# Patient Record
Sex: Female | Born: 1965 | Race: Black or African American | Hispanic: No | Marital: Single | Smoking: Never smoker
Health system: Southern US, Community
[De-identification: ages and names within clinical notes are randomized; demographics above are authoritative.]

## PROBLEM LIST (undated history)

## (undated) DIAGNOSIS — I10 Essential (primary) hypertension: Secondary | ICD-10-CM

## (undated) DIAGNOSIS — Z8719 Personal history of other diseases of the digestive system: Secondary | ICD-10-CM

## (undated) DIAGNOSIS — Z8041 Family history of malignant neoplasm of ovary: Secondary | ICD-10-CM

## (undated) DIAGNOSIS — Z8711 Personal history of peptic ulcer disease: Secondary | ICD-10-CM

## (undated) DIAGNOSIS — Z803 Family history of malignant neoplasm of breast: Secondary | ICD-10-CM

## (undated) DIAGNOSIS — Z800 Family history of malignant neoplasm of digestive organs: Secondary | ICD-10-CM

## (undated) DIAGNOSIS — Z8042 Family history of malignant neoplasm of prostate: Secondary | ICD-10-CM

## (undated) DIAGNOSIS — B019 Varicella without complication: Secondary | ICD-10-CM

## (undated) HISTORY — DX: Personal history of other diseases of the digestive system: Z87.19

## (undated) HISTORY — DX: Family history of malignant neoplasm of digestive organs: Z80.0

## (undated) HISTORY — DX: Personal history of peptic ulcer disease: Z87.11

## (undated) HISTORY — DX: Family history of malignant neoplasm of breast: Z80.3

## (undated) HISTORY — PX: FOOT SURGERY: SHX648

## (undated) HISTORY — DX: Varicella without complication: B01.9

## (undated) HISTORY — DX: Essential (primary) hypertension: I10

## (undated) HISTORY — DX: Family history of malignant neoplasm of prostate: Z80.42

## (undated) HISTORY — DX: Family history of malignant neoplasm of ovary: Z80.41

---

## 1999-01-15 ENCOUNTER — Other Ambulatory Visit: Admission: RE | Admit: 1999-01-15 | Discharge: 1999-01-15 | Payer: Self-pay | Admitting: Obstetrics and Gynecology

## 2000-03-23 ENCOUNTER — Emergency Department (HOSPITAL_COMMUNITY): Admission: EM | Admit: 2000-03-23 | Discharge: 2000-03-23 | Payer: Self-pay | Admitting: Emergency Medicine

## 2001-03-01 ENCOUNTER — Other Ambulatory Visit: Admission: RE | Admit: 2001-03-01 | Discharge: 2001-03-01 | Payer: Self-pay | Admitting: Obstetrics and Gynecology

## 2002-01-10 ENCOUNTER — Ambulatory Visit (HOSPITAL_COMMUNITY): Admission: RE | Admit: 2002-01-10 | Discharge: 2002-01-10 | Payer: Self-pay | Admitting: Obstetrics and Gynecology

## 2003-02-22 ENCOUNTER — Other Ambulatory Visit: Admission: RE | Admit: 2003-02-22 | Discharge: 2003-02-22 | Payer: Self-pay | Admitting: Obstetrics and Gynecology

## 2004-05-11 ENCOUNTER — Other Ambulatory Visit: Admission: RE | Admit: 2004-05-11 | Discharge: 2004-05-11 | Payer: Self-pay | Admitting: Obstetrics and Gynecology

## 2004-11-11 ENCOUNTER — Encounter: Admission: RE | Admit: 2004-11-11 | Discharge: 2004-11-11 | Payer: Self-pay | Admitting: Family Medicine

## 2005-08-31 ENCOUNTER — Encounter: Admission: RE | Admit: 2005-08-31 | Discharge: 2005-08-31 | Payer: Self-pay | Admitting: Obstetrics and Gynecology

## 2005-12-23 ENCOUNTER — Ambulatory Visit (HOSPITAL_COMMUNITY): Admission: RE | Admit: 2005-12-23 | Discharge: 2005-12-24 | Payer: Self-pay | Admitting: Obstetrics and Gynecology

## 2007-04-13 HISTORY — PX: ABDOMINAL HYSTERECTOMY: SHX81

## 2010-05-28 ENCOUNTER — Other Ambulatory Visit: Payer: Self-pay | Admitting: Family Medicine

## 2010-05-28 ENCOUNTER — Ambulatory Visit
Admission: RE | Admit: 2010-05-28 | Discharge: 2010-05-28 | Disposition: A | Discharge status: Home / Self Care | Payer: Self-pay | Source: Ambulatory Visit | Attending: Family Medicine | Admitting: Family Medicine

## 2010-05-28 DIAGNOSIS — R05 Cough: Secondary | ICD-10-CM

## 2011-11-08 ENCOUNTER — Ambulatory Visit (INDEPENDENT_AMBULATORY_CARE_PROVIDER_SITE_OTHER): Payer: BC Managed Care – PPO | Admitting: Physician Assistant

## 2011-11-08 VITALS — BP 118/80 | HR 50 | Temp 98.0°F | Resp 16 | Ht 67.75 in | Wt 200.2 lb

## 2011-11-08 DIAGNOSIS — L259 Unspecified contact dermatitis, unspecified cause: Secondary | ICD-10-CM

## 2011-11-08 DIAGNOSIS — L255 Unspecified contact dermatitis due to plants, except food: Secondary | ICD-10-CM

## 2011-11-08 DIAGNOSIS — L309 Dermatitis, unspecified: Secondary | ICD-10-CM

## 2011-11-08 DIAGNOSIS — L299 Pruritus, unspecified: Secondary | ICD-10-CM

## 2011-11-08 DIAGNOSIS — L237 Allergic contact dermatitis due to plants, except food: Secondary | ICD-10-CM

## 2011-11-08 MED ORDER — TRIAMCINOLONE ACETONIDE 0.5 % EX OINT: TOPICAL_OINTMENT | Freq: Two times a day (BID) | CUTANEOUS | Status: AC

## 2011-11-08 MED ORDER — HYDROXYZINE HCL 25 MG PO TABS: 12.5000 mg | ORAL_TABLET | Freq: Three times a day (TID) | ORAL | Status: AC | PRN

## 2011-11-08 NOTE — Progress Notes (Signed)
  Subjective:    Patient ID: Tamara Morton, female    DOB: [birth year], 46 y.o.   MRN: [id number]  HPI This 46 y.o. Female presents for itchy rash since yard work on 7/25.  Developed symptoms on the left side of the neck, upper chest and one small area at the umbilicus.  No respiratory symptoms. Topical Benadryl and oral Benadryl have not been effective.  Review of Systems As above.   Past Medical History  Diagnosis Date  . Hypertension     Past Surgical History  Procedure Date  . Abdominal hysterectomy     uterine fibroids    Prior to Admission medications   Medication Sig Start Date End Date Taking? Authorizing Provider  diltiazem (CARDIZEM CD) 240 MG 24 hr capsule Take 240 mg by mouth daily.   Yes Historical Provider, MD  spironolactone (ALDACTONE) 25 MG tablet Take 25 mg by mouth daily.   Yes Historical Provider, MD    No Known Allergies  History   Social History  . Marital Status: Single    Spouse Name: N/A    Number of Children: 0  . Years of Education: N/A   Occupational History  . Production (medical supplies)   Social History Main Topics  . Smoking status: Never Smoker     Family History  Problem Relation Age of Onset  . Hypertension Mother   . Gout Mother   . Glaucoma Mother   . Hypertension Father        Objective:   Physical Exam Blood pressure 118/80, pulse 50, temperature 98 F (36.7 C), temperature source Oral, resp. rate 16, height 5' 7.75" (1.721 m), weight 200 lb 3.2 oz (90.81 kg), SpO2 97.00%. Body mass index is 30.67 kg/(m^2). Well-developed, well nourished BF who is awake, alert and oriented, in NAD. HEENT: [place]/AT, sclera and conjunctiva are clear.  Lungs: Normal effort Skin: warm and dry with urticarial lesions on the left side of the neck, the upper chest and umbilicus.  No drainage or induration.      Assessment & Plan:   1. Dermatitis    2. Itching  hydrOXYzine (ATARAX/VISTARIL) 25 MG tablet, triamcinolone ointment (KENALOG)  0.5 %  3. Poison ivy dermatitis     Patient Instructions  IN addition to the prescribed medications, use over-the-counter Claritin or Zyrtec or Allegra in the morning (the Atarax will make you too sleepy to take during the day while you are working, so you may need to take it only at bedtime).  Try to stay cool and dry, as getting hot and sweaty (or taking a hot shower) will increase your itching.

## 2011-11-08 NOTE — Patient Instructions (Signed)
IN addition to the prescribed medications, use over-the-counter Claritin or Zyrtec or Allegra in the morning (the Atarax will make you too sleepy to take during the day while you are working, so you may need to take it only at bedtime).  Try to stay cool and dry, as getting hot and sweaty (or taking a hot shower) will increase your itching.

## 2015-05-07 ENCOUNTER — Encounter: Payer: Self-pay | Admitting: *Deleted

## 2015-05-07 ENCOUNTER — Telehealth: Payer: Self-pay | Admitting: *Deleted

## 2015-05-07 NOTE — Telephone Encounter (Signed)
Unable to reach patient at time of Pre-Visit Call.  Left message for patient to return call when available.    

## 2015-05-07 NOTE — Telephone Encounter (Signed)
Pre-Visit Call completed with patient and chart updated.   Pre-Visit Info documented in Specialty Comments under [employer].    

## 2015-05-08 ENCOUNTER — Ambulatory Visit (INDEPENDENT_AMBULATORY_CARE_PROVIDER_SITE_OTHER): Payer: Managed Care, Other (non HMO) | Admitting: Family Medicine

## 2015-05-08 ENCOUNTER — Other Ambulatory Visit: Payer: Self-pay

## 2015-05-08 ENCOUNTER — Encounter: Payer: Self-pay | Admitting: Family Medicine

## 2015-05-08 VITALS — BP 126/80 | HR 80 | Temp 98.9°F | Ht 68.75 in | Wt 215.4 lb

## 2015-05-08 DIAGNOSIS — Z0000 Encounter for general adult medical examination without abnormal findings: Secondary | ICD-10-CM

## 2015-05-08 DIAGNOSIS — Z23 Encounter for immunization: Secondary | ICD-10-CM

## 2015-05-08 DIAGNOSIS — I10 Essential (primary) hypertension: Secondary | ICD-10-CM

## 2015-05-08 DIAGNOSIS — Z114 Encounter for screening for human immunodeficiency virus [HIV]: Secondary | ICD-10-CM

## 2015-05-08 MED ORDER — SPIRONOLACTONE 25 MG PO TABS: 25.0000 mg | ORAL_TABLET | Freq: Every day | ORAL | Status: DC

## 2015-05-08 MED ORDER — DILTIAZEM HCL ER COATED BEADS 240 MG PO CP24: 240.0000 mg | ORAL_CAPSULE | Freq: Every day | ORAL | Status: DC

## 2015-05-08 NOTE — Patient Instructions (Signed)
Preventive Care for Adults, Female A healthy lifestyle and preventive care can promote health and wellness. Preventive health guidelines for women include the following key practices.  A routine yearly physical is a good way to check with your health care provider about your health and preventive screening. It is a chance to share any concerns and updates on your health and to receive a thorough exam.  Visit your dentist for a routine exam and preventive care every 6 months. Brush your teeth twice a day and floss once a day. Good oral hygiene prevents tooth decay and gum disease.  The frequency of eye exams is based on your age, health, family medical history, use of contact lenses, and other factors. Follow your health care provider's recommendations for frequency of eye exams.  Eat a healthy diet. Foods like vegetables, fruits, whole grains, low-fat dairy products, and lean protein foods contain the nutrients you need without too many calories. Decrease your intake of foods high in solid fats, added sugars, and salt. Eat the right amount of calories for you.Get information about a proper diet from your health care provider, if necessary.  Regular physical exercise is one of the most important things you can do for your health. Most adults should get at least 150 minutes of moderate-intensity exercise (any activity that increases your heart rate and causes you to sweat) each week. In addition, most adults need muscle-strengthening exercises on 2 or more days a week.  Maintain a healthy weight. The body mass index (BMI) is a screening tool to identify possible weight problems. It provides an estimate of body fat based on height and weight. Your health care provider can find your BMI and can help you achieve or maintain a healthy weight.For adults 20 years and older:  A BMI below 18.5 is considered underweight.  A BMI of 18.5 to 24.9 is normal.  A BMI of 25 to 29.9 is considered overweight.  A  BMI of 30 and above is considered obese.  Maintain normal blood lipids and cholesterol levels by exercising and minimizing your intake of saturated fat. Eat a balanced diet with plenty of fruit and vegetables. Blood tests for lipids and cholesterol should begin at age 45 and be repeated every 5 years. If your lipid or cholesterol levels are high, you are over 50, or you are at high risk for heart disease, you may need your cholesterol levels checked more frequently.Ongoing high lipid and cholesterol levels should be treated with medicines if diet and exercise are not working.  If you smoke, find out from your health care provider how to quit. If you do not use tobacco, do not start.  Lung cancer screening is recommended for adults aged 45-80 years who are at high risk for developing lung cancer because of a history of smoking. A yearly low-dose CT scan of the lungs is recommended for people who have at least a 30-pack-year history of smoking and are a current smoker or have quit within the past 15 years. A pack year of smoking is smoking an average of 1 pack of cigarettes a day for 1 year (for example: 1 pack a day for 30 years or 2 packs a day for 15 years). Yearly screening should continue until the smoker has stopped smoking for at least 15 years. Yearly screening should be stopped for people who develop a health problem that would prevent them from having lung cancer treatment.  If you are pregnant, do not drink alcohol. If you are  breastfeeding, be very cautious about drinking alcohol. If you are not pregnant and choose to drink alcohol, do not have more than 1 drink per day. One drink is considered to be 12 ounces (355 mL) of beer, 5 ounces (148 mL) of wine, or 1.5 ounces (44 mL) of liquor.  Avoid use of street drugs. Do not share needles with anyone. Ask for help if you need support or instructions about stopping the use of drugs.  High blood pressure causes heart disease and increases the risk  of stroke. Your blood pressure should be checked at least every 1 to 2 years. Ongoing high blood pressure should be treated with medicines if weight loss and exercise do not work.  If you are 55-79 years old, ask your health care provider if you should take aspirin to prevent strokes.  Diabetes screening is done by taking a blood sample to check your blood glucose level after you have not eaten for a certain period of time (fasting). If you are not overweight and you do not have risk factors for diabetes, you should be screened once every 3 years starting at age 45. If you are overweight or obese and you are 40-70 years of age, you should be screened for diabetes every year as part of your cardiovascular risk assessment.  Breast cancer screening is essential preventive care for women. You should practice "breast self-awareness." This means understanding the normal appearance and feel of your breasts and may include breast self-examination. Any changes detected, no matter how small, should be reported to a health care provider. Women in their 20s and 30s should have a clinical breast exam (CBE) by a health care provider as part of a regular health exam every 1 to 3 years. After age 40, women should have a CBE every year. Starting at age 40, women should consider having a mammogram (breast X-ray test) every year. Women who have a family history of breast cancer should talk to their health care provider about genetic screening. Women at a high risk of breast cancer should talk to their health care providers about having an MRI and a mammogram every year.  Breast cancer gene (BRCA)-related cancer risk assessment is recommended for women who have family members with BRCA-related cancers. BRCA-related cancers include breast, ovarian, tubal, and peritoneal cancers. Having family members with these cancers may be associated with an increased risk for harmful changes (mutations) in the breast cancer genes BRCA1 and  BRCA2. Results of the assessment will determine the need for genetic counseling and BRCA1 and BRCA2 testing.  Your health care provider may recommend that you be screened regularly for cancer of the pelvic organs (ovaries, uterus, and vagina). This screening involves a pelvic examination, including checking for microscopic changes to the surface of your cervix (Pap test). You may be encouraged to have this screening done every 3 years, beginning at age 21.  For women ages 30-65, health care providers may recommend pelvic exams and Pap testing every 3 years, or they may recommend the Pap and pelvic exam, combined with testing for human papilloma virus (HPV), every 5 years. Some types of HPV increase your risk of cervical cancer. Testing for HPV may also be done on women of any age with unclear Pap test results.  Other health care providers may not recommend any screening for nonpregnant women who are considered low risk for pelvic cancer and who do not have symptoms. Ask your health care provider if a screening pelvic exam is right for   you.  If you have had past treatment for cervical cancer or a condition that could lead to cancer, you need Pap tests and screening for cancer for at least 20 years after your treatment. If Pap tests have been discontinued, your risk factors (such as having a new sexual partner) need to be reassessed to determine if screening should resume. Some women have medical problems that increase the chance of getting cervical cancer. In these cases, your health care provider may recommend more frequent screening and Pap tests.  Colorectal cancer can be detected and often prevented. Most routine colorectal cancer screening begins at the age of 50 years and continues through age 75 years. However, your health care provider may recommend screening at an earlier age if you have risk factors for colon cancer. On a yearly basis, your health care provider may provide home test kits to check  for hidden blood in the stool. Use of a small camera at the end of a tube, to directly examine the colon (sigmoidoscopy or colonoscopy), can detect the earliest forms of colorectal cancer. Talk to your health care provider about this at age 50, when routine screening begins. Direct exam of the colon should be repeated every 5-10 years through age 75 years, unless early forms of precancerous polyps or small growths are found.  People who are at an increased risk for hepatitis B should be screened for this virus. You are considered at high risk for hepatitis B if:  You were born in a country where hepatitis B occurs often. Talk with your health care provider about which countries are considered high risk.  Your parents were born in a high-risk country and you have not received a shot to protect against hepatitis B (hepatitis B vaccine).  You have HIV or AIDS.  You use needles to inject street drugs.  You live with, or have sex with, someone who has hepatitis B.  You get hemodialysis treatment.  You take certain medicines for conditions like cancer, organ transplantation, and autoimmune conditions.  Hepatitis C blood testing is recommended for all people born from 1945 through 1965 and any individual with known risks for hepatitis C.  Practice safe sex. Use condoms and avoid high-risk sexual practices to reduce the spread of sexually transmitted infections (STIs). STIs include gonorrhea, chlamydia, syphilis, trichomonas, herpes, HPV, and human immunodeficiency virus (HIV). Herpes, HIV, and HPV are viral illnesses that have no cure. They can result in disability, cancer, and death.  You should be screened for sexually transmitted illnesses (STIs) including gonorrhea and chlamydia if:  You are sexually active and are younger than 24 years.  You are older than 24 years and your health care provider tells you that you are at risk for this type of infection.  Your sexual activity has changed  since you were last screened and you are at an increased risk for chlamydia or gonorrhea. Ask your health care provider if you are at risk.  If you are at risk of being infected with HIV, it is recommended that you take a prescription medicine daily to prevent HIV infection. This is called preexposure prophylaxis (PrEP). You are considered at risk if:  You are sexually active and do not regularly use condoms or know the HIV status of your partner(s).  You take drugs by injection.  You are sexually active with a partner who has HIV.  Talk with your health care provider about whether you are at high risk of being infected with HIV. If   you choose to begin PrEP, you should first be tested for HIV. You should then be tested every 3 months for as [place] as you are taking PrEP.  Osteoporosis is a disease in which the bones lose minerals and strength with aging. This can result in serious bone fractures or breaks. The risk of osteoporosis can be identified using a bone density scan. Women ages 67 years and over and women at risk for fractures or osteoporosis should discuss screening with their health care providers. Ask your health care provider whether you should take a calcium supplement or vitamin D to reduce the rate of osteoporosis.  Menopause can be associated with physical symptoms and risks. Hormone replacement therapy is available to decrease symptoms and risks. You should talk to your health care provider about whether hormone replacement therapy is right for you.  Use sunscreen. Apply sunscreen liberally and repeatedly throughout the day. You should seek shade when your shadow is shorter than you. Protect yourself by wearing [place] sleeves, pants, a wide-brimmed hat, and sunglasses year round, whenever you are outdoors.  Once a month, do a whole body skin exam, using a mirror to look at the skin on your back. Tell your health care provider of new moles, moles that have irregular borders, moles that  are larger than a pencil eraser, or moles that have changed in shape or color.  Stay current with required vaccines (immunizations).  Influenza vaccine. All adults should be immunized every year.  Tetanus, diphtheria, and acellular pertussis (Td, Tdap) vaccine. Pregnant women should receive 1 dose of Tdap vaccine during each pregnancy. The dose should be obtained regardless of the length of time since the last dose. Immunization is preferred during the 27th-36th week of gestation. An adult who has not previously received Tdap or who does not know her vaccine status should receive 1 dose of Tdap. This initial dose should be followed by tetanus and diphtheria toxoids (Td) booster doses every 10 years. Adults with an unknown or incomplete history of completing a 3-dose immunization series with Td-containing vaccines should begin or complete a primary immunization series including a Tdap dose. Adults should receive a Td booster every 10 years.  Varicella vaccine. An adult without evidence of immunity to varicella should receive 2 doses or a second dose if she has previously received 1 dose. Pregnant females who do not have evidence of immunity should receive the first dose after pregnancy. This first dose should be obtained before leaving the health care facility. The second dose should be obtained 4-8 weeks after the first dose.  Human papillomavirus (HPV) vaccine. Females aged 13-26 years who have not received the vaccine previously should obtain the 3-dose series. The vaccine is not recommended for use in pregnant females. However, pregnancy testing is not needed before receiving a dose. If a female is found to be pregnant after receiving a dose, no treatment is needed. In that case, the remaining doses should be delayed until after the pregnancy. Immunization is recommended for any person with an immunocompromised condition through the age of 61 years if she did not get any or all doses earlier. During the  3-dose series, the second dose should be obtained 4-8 weeks after the first dose. The third dose should be obtained 24 weeks after the first dose and 16 weeks after the second dose.  Zoster vaccine. One dose is recommended for adults aged 30 years or older unless certain conditions are present.  Measles, mumps, and rubella (MMR) vaccine. Adults born  before 1957 generally are considered immune to measles and mumps. Adults born in 1957 or later should have 1 or more doses of MMR vaccine unless there is a contraindication to the vaccine or there is laboratory evidence of immunity to each of the three diseases. A routine second dose of MMR vaccine should be obtained at least 28 days after the first dose for students attending postsecondary schools, health care workers, or international travelers. People who received inactivated measles vaccine or an unknown type of measles vaccine during 1963-1967 should receive 2 doses of MMR vaccine. People who received inactivated mumps vaccine or an unknown type of mumps vaccine before 1979 and are at high risk for mumps infection should consider immunization with 2 doses of MMR vaccine. For females of childbearing age, rubella immunity should be determined. If there is no evidence of immunity, females who are not pregnant should be vaccinated. If there is no evidence of immunity, females who are pregnant should delay immunization until after pregnancy. Unvaccinated health care workers born before 1957 who lack laboratory evidence of measles, mumps, or rubella immunity or laboratory confirmation of disease should consider measles and mumps immunization with 2 doses of MMR vaccine or rubella immunization with 1 dose of MMR vaccine.  Pneumococcal 13-valent conjugate (PCV13) vaccine. When indicated, a person who is uncertain of his immunization history and has no record of immunization should receive the PCV13 vaccine. All adults 65 years of age and older should receive this  vaccine. An adult aged 19 years or older who has certain medical conditions and has not been previously immunized should receive 1 dose of PCV13 vaccine. This PCV13 should be followed with a dose of pneumococcal polysaccharide (PPSV23) vaccine. Adults who are at high risk for pneumococcal disease should obtain the PPSV23 vaccine at least 8 weeks after the dose of PCV13 vaccine. Adults older than 51 years of age who have normal immune system function should obtain the PPSV23 vaccine dose at least 1 year after the dose of PCV13 vaccine.  Pneumococcal polysaccharide (PPSV23) vaccine. When PCV13 is also indicated, PCV13 should be obtained first. All adults aged 65 years and older should be immunized. An adult younger than age 65 years who has certain medical conditions should be immunized. Any person who resides in a nursing home or [place]-term care facility should be immunized. An adult smoker should be immunized. People with an immunocompromised condition and certain other conditions should receive both PCV13 and PPSV23 vaccines. People with human immunodeficiency virus (HIV) infection should be immunized as soon as possible after diagnosis. Immunization during chemotherapy or radiation therapy should be avoided. Routine use of PPSV23 vaccine is not recommended for American Indians, [place] Natives, or people younger than 65 years unless there are medical conditions that require PPSV23 vaccine. When indicated, people who have unknown immunization and have no record of immunization should receive PPSV23 vaccine. One-time revaccination 5 years after the first dose of PPSV23 is recommended for people aged 19-64 years who have chronic kidney failure, nephrotic syndrome, asplenia, or immunocompromised conditions. People who received 1-2 doses of PPSV23 before age 65 years should receive another dose of PPSV23 vaccine at age 65 years or later if at least 5 years have passed since the previous dose. Doses of PPSV23 are not  needed for people immunized with PPSV23 at or after age 65 years.  Meningococcal vaccine. Adults with asplenia or persistent complement component deficiencies should receive 2 doses of quadrivalent meningococcal conjugate (MenACWY-D) vaccine. The doses should be obtained   at least 2 months apart. Microbiologists working with certain meningococcal bacteria, [place] recruits, people at risk during an outbreak, and people who travel to or live in countries with a high rate of meningitis should be immunized. A first-year college student up through age 34 years who is living in a residence hall should receive a dose if she did not receive a dose on or after her 16th birthday. Adults who have certain high-risk conditions should receive one or more doses of vaccine.  Hepatitis A vaccine. Adults who wish to be protected from this disease, have certain high-risk conditions, work with hepatitis A-infected animals, work in hepatitis A research labs, or travel to or work in countries with a high rate of hepatitis A should be immunized. Adults who were previously unvaccinated and who anticipate close contact with an international adoptee during the first 60 days after arrival in the [place] from a country with a high rate of hepatitis A should be immunized.  Hepatitis B vaccine. Adults who wish to be protected from this disease, have certain high-risk conditions, may be exposed to blood or other infectious body fluids, are household contacts or sex partners of hepatitis B positive people, are clients or workers in certain care facilities, or travel to or work in countries with a high rate of hepatitis B should be immunized.  Haemophilus influenzae type b (Hib) vaccine. A previously unvaccinated person with asplenia or sickle cell disease or having a scheduled splenectomy should receive 1 dose of Hib vaccine. Regardless of previous immunization, a recipient of a hematopoietic stem cell transplant should receive a  3-dose series 6-12 months after her successful transplant. Hib vaccine is not recommended for adults with HIV infection. Preventive Services / Frequency Ages 35 to 4 years  Blood pressure check.** / Every 3-5 years.  Lipid and cholesterol check.** / Every 5 years beginning at age 60.  Clinical breast exam.** / Every 3 years for women in their 71s and 10s.  BRCA-related cancer risk assessment.** / For women who have family members with a BRCA-related cancer (breast, ovarian, tubal, or peritoneal cancers).  Pap test.** / Every 2 years from ages 76 through 26. Every 3 years starting at age 61 through age 76 or 93 with a history of 3 consecutive normal Pap tests.  HPV screening.** / Every 3 years from ages 37 through ages 60 to 51 with a history of 3 consecutive normal Pap tests.  Hepatitis C blood test.** / For any individual with known risks for hepatitis C.  Skin self-exam. / Monthly.  Influenza vaccine. / Every year.  Tetanus, diphtheria, and acellular pertussis (Tdap, Td) vaccine.** / Consult your health care provider. Pregnant women should receive 1 dose of Tdap vaccine during each pregnancy. 1 dose of Td every 10 years.  Varicella vaccine.** / Consult your health care provider. Pregnant females who do not have evidence of immunity should receive the first dose after pregnancy.  HPV vaccine. / 3 doses over 6 months, if 93 and younger. The vaccine is not recommended for use in pregnant females. However, pregnancy testing is not needed before receiving a dose.  Measles, mumps, rubella (MMR) vaccine.** / You need at least 1 dose of MMR if you were born in 1957 or later. You may also need a 2nd dose. For females of childbearing age, rubella immunity should be determined. If there is no evidence of immunity, females who are not pregnant should be vaccinated. If there is no evidence of immunity, females who are  pregnant should delay immunization until after pregnancy.  Pneumococcal  13-valent conjugate (PCV13) vaccine.** / Consult your health care provider.  Pneumococcal polysaccharide (PPSV23) vaccine.** / 1 to 2 doses if you smoke cigarettes or if you have certain conditions.  Meningococcal vaccine.** / 1 dose if you are age 68 to 8 years and a Market researcher living in a residence hall, or have one of several medical conditions, you need to get vaccinated against meningococcal disease. You may also need additional booster doses.  Hepatitis A vaccine.** / Consult your health care provider.  Hepatitis B vaccine.** / Consult your health care provider.  Haemophilus influenzae type b (Hib) vaccine.** / Consult your health care provider. Ages 7 to 53 years  Blood pressure check.** / Every year.  Lipid and cholesterol check.** / Every 5 years beginning at age 25 years.  Lung cancer screening. / Every year if you are aged 11-80 years and have a 30-pack-year history of smoking and currently smoke or have quit within the past 15 years. Yearly screening is stopped once you have quit smoking for at least 15 years or develop a health problem that would prevent you from having lung cancer treatment.  Clinical breast exam.** / Every year after age 48 years.  BRCA-related cancer risk assessment.** / For women who have family members with a BRCA-related cancer (breast, ovarian, tubal, or peritoneal cancers).  Mammogram.** / Every year beginning at age 41 years and continuing for as [place] as you are in good health. Consult with your health care provider.  Pap test.** / Every 3 years starting at age 65 years through age 37 or 70 years with a history of 3 consecutive normal Pap tests.  HPV screening.** / Every 3 years from ages 72 years through ages 60 to 40 years with a history of 3 consecutive normal Pap tests.  Fecal occult blood test (FOBT) of stool. / Every year beginning at age 21 years and continuing until age 5 years. You may not need to do this test if you get  a colonoscopy every 10 years.  Flexible sigmoidoscopy or colonoscopy.** / Every 5 years for a flexible sigmoidoscopy or every 10 years for a colonoscopy beginning at age 35 years and continuing until age 48 years.  Hepatitis C blood test.** / For all people born from [birth year] through 1965 and any individual with known risks for hepatitis C.  Skin self-exam. / Monthly.  Influenza vaccine. / Every year.  Tetanus, diphtheria, and acellular pertussis (Tdap/Td) vaccine.** / Consult your health care provider. Pregnant women should receive 1 dose of Tdap vaccine during each pregnancy. 1 dose of Td every 10 years.  Varicella vaccine.** / Consult your health care provider. Pregnant females who do not have evidence of immunity should receive the first dose after pregnancy.  Zoster vaccine.** / 1 dose for adults aged 30 years or older.  Measles, mumps, rubella (MMR) vaccine.** / You need at least 1 dose of MMR if you were born in 1957 or later. You may also need a second dose. For females of childbearing age, rubella immunity should be determined. If there is no evidence of immunity, females who are not pregnant should be vaccinated. If there is no evidence of immunity, females who are pregnant should delay immunization until after pregnancy.  Pneumococcal 13-valent conjugate (PCV13) vaccine.** / Consult your health care provider.  Pneumococcal polysaccharide (PPSV23) vaccine.** / 1 to 2 doses if you smoke cigarettes or if you have certain conditions.  Meningococcal vaccine.** /  Consult your health care provider.  Hepatitis A vaccine.** / Consult your health care provider.  Hepatitis B vaccine.** / Consult your health care provider.  Haemophilus influenzae type b (Hib) vaccine.** / Consult your health care provider. Ages 64 years and over  Blood pressure check.** / Every year.  Lipid and cholesterol check.** / Every 5 years beginning at age 23 years.  Lung cancer screening. / Every year if you  are aged 16-80 years and have a 30-pack-year history of smoking and currently smoke or have quit within the past 15 years. Yearly screening is stopped once you have quit smoking for at least 15 years or develop a health problem that would prevent you from having lung cancer treatment.  Clinical breast exam.** / Every year after age 74 years.  BRCA-related cancer risk assessment.** / For women who have family members with a BRCA-related cancer (breast, ovarian, tubal, or peritoneal cancers).  Mammogram.** / Every year beginning at age 44 years and continuing for as [place] as you are in good health. Consult with your health care provider.  Pap test.** / Every 3 years starting at age 58 years through age 22 or 39 years with 3 consecutive normal Pap tests. Testing can be stopped between 65 and 70 years with 3 consecutive normal Pap tests and no abnormal Pap or HPV tests in the past 10 years.  HPV screening.** / Every 3 years from ages 64 years through ages 70 or 61 years with a history of 3 consecutive normal Pap tests. Testing can be stopped between 65 and 70 years with 3 consecutive normal Pap tests and no abnormal Pap or HPV tests in the past 10 years.  Fecal occult blood test (FOBT) of stool. / Every year beginning at age 40 years and continuing until age 27 years. You may not need to do this test if you get a colonoscopy every 10 years.  Flexible sigmoidoscopy or colonoscopy.** / Every 5 years for a flexible sigmoidoscopy or every 10 years for a colonoscopy beginning at age 7 years and continuing until age 32 years.  Hepatitis C blood test.** / For all people born from [birth year] through 1966 and any individual with known risks for hepatitis C.  Osteoporosis screening.** / A one-time screening for women ages 30 years and over and women at risk for fractures or osteoporosis.  Skin self-exam. / Monthly.  Influenza vaccine. / Every year.  Tetanus, diphtheria, and acellular pertussis (Tdap/Td)  vaccine.** / 1 dose of Td every 10 years.  Varicella vaccine.** / Consult your health care provider.  Zoster vaccine.** / 1 dose for adults aged 35 years or older.  Pneumococcal 13-valent conjugate (PCV13) vaccine.** / Consult your health care provider.  Pneumococcal polysaccharide (PPSV23) vaccine.** / 1 dose for all adults aged 46 years and older.  Meningococcal vaccine.** / Consult your health care provider.  Hepatitis A vaccine.** / Consult your health care provider.  Hepatitis B vaccine.** / Consult your health care provider.  Haemophilus influenzae type b (Hib) vaccine.** / Consult your health care provider. ** Family history and personal history of risk and conditions may change your health care provider's recommendations.   This information is not intended to replace advice given to you by your health care provider. Make sure you discuss any questions you have with your health care provider.   Document Released: 05/25/2001 Document Revised: 04/19/2014 Document Reviewed: 08/24/2010 Elsevier Interactive Patient Education [employer].

## 2015-05-08 NOTE — Progress Notes (Signed)
Subjective:     Tamara Morton is a 50 y.o. female and is here for a comprehensive physical exam. The patient reports no problems.  Social History   Social History  . Marital Status: Single    Spouse Name: N/A  . Number of Children: N/A  . Years of Education: N/A   Occupational History  . Radiographer, therapeutic   Social History Main Topics  . Smoking status: Never Smoker   . Smokeless tobacco: Not on file  . Alcohol Use: 0.0 oz/week    0 Standard drinks or equivalent per week     Comment: rare  . Drug Use: No  . Sexual Activity: No   Other Topics Concern  . Not on file   Social History Narrative   exericse-- no   Health Maintenance  Topic Date Due  . HIV Screening  02/16/1980  . COLONOSCOPY  02/16/2015  . INFLUENZA VACCINE  11/11/2015  . MAMMOGRAM  07/31/2016  . TETANUS/TDAP  05/07/2021    The following portions of the patient's history were reviewed and updated as appropriate:  She  has a past medical history of Hypertension; Chicken pox; and History of stomach ulcers. She  does not have a problem list on file. She  has past surgical history that includes Foot surgery (Right, "About 6-7 yrs ago") and Abdominal hysterectomy (2009). Her family history includes Colon cancer in her maternal uncle; Diabetes in her maternal aunt and maternal grandmother; Glaucoma in her mother; Gout in her mother; Heart disease in her father; Hypertension in her father and mother; Ovarian cancer in her maternal aunt; Stomach cancer in her maternal aunt. She  reports that she has never smoked. She does not have any smokeless tobacco history on file. She reports that she drinks alcohol. She reports that she does not use illicit drugs. She has a current medication list which includes the following prescription(s): diltiazem and spironolactone. No current outpatient prescriptions on file prior to visit.   No current facility-administered medications on file prior to visit.    She has No Known Allergies..  Review of Systems Review of Systems  Constitutional: Negative for activity change, appetite change and fatigue.  HENT: Negative for hearing loss, congestion, tinnitus and ear discharge.  dentist q49mEyes: Negative for visual disturbance (see optho q1y -- vision corrected to 20/20 with glasses).  Respiratory: Negative for cough, chest tightness and shortness of breath.   Cardiovascular: Negative for chest pain, palpitations and leg swelling.  Gastrointestinal: Negative for abdominal pain, diarrhea, constipation and abdominal distention.  Genitourinary: Negative for urgency, frequency, decreased urine volume and difficulty urinating.  Musculoskeletal: Negative for back pain, arthralgias and gait problem.  Skin: Negative for color change, pallor and rash.  Neurological: Negative for dizziness, light-headedness, numbness and headaches.  Hematological: Negative for adenopathy. Does not bruise/bleed easily.  Psychiatric/Behavioral: Negative for suicidal ideas, confusion, sleep disturbance, self-injury, dysphoric mood, decreased concentration and agitation.      Objective:    BP 126/80 mmHg  Pulse 80  Temp(Src) 98.9 F (37.2 C) (Oral)  Ht 5' 8.75" (1.746 m)  Wt 215 lb 6.4 oz (97.705 kg)  BMI 32.05 kg/m2  SpO2 98% General appearance: alert, cooperative, appears stated age and no distress Head: Normocephalic, without obvious abnormality, atraumatic Eyes: conjunctivae/corneas clear. PERRL, EOM's intact. Fundi benign. Ears: normal TM's and external ear canals both ears Nose: Nares normal. Septum midline. Mucosa normal. No drainage or sinus tenderness. Throat: lips, mucosa, and tongue normal; teeth  and gums normal Neck: no adenopathy, no carotid bruit, no JVD, supple, symmetrical, trachea midline and thyroid not enlarged, symmetric, no tenderness/mass/nodules Back: symmetric, no curvature. ROM normal. No CVA tenderness. Lungs: clear to auscultation  bilaterally Breasts: normal appearance, no masses or tenderness Heart: regular rate and rhythm, S1, S2 normal, no murmur, click, rub or gallop Abdomen: soft, non-tender; bowel sounds normal; no masses,  no organomegaly Pelvic: deferred Extremities: extremities normal, atraumatic, no cyanosis or edema Pulses: 2+ and symmetric Skin: Skin color, texture, turgor normal. No rashes or lesions Lymph nodes: Cervical, supraclavicular, and axillary nodes normal. Neurologic: Alert and oriented X 3, normal strength and tone. Normal symmetric reflexes. Normal coordination and gait Psych-no anxiety , no depression      Assessment:    Healthy female exam.      Plan:    ghm utd Check labs See After Visit Summary for Counseling Recommendations    1. Need for immunization against influenza  - Flu Vaccine QUAD 36+ mos IM (Fluarix)  2. Preventative health care See above - Ambulatory referral to Gastroenterology - Comp Met (CMET); Future - CBC with Differential/Platelet; Future - Lipid panel; Future - Microalbumin / creatinine urine ratio; Future - POCT urinalysis dipstick; Future - TSH; Future  3. Essential hypertension Stable Cont spironolactone and cardizem - Comp Met (CMET); Future - CBC with Differential/Platelet; Future - Lipid panel; Future - Microalbumin / creatinine urine ratio; Future - POCT urinalysis dipstick; Future - TSH; Future  4. Encounter for screening for HIV  - HIV antibody; Future

## 2015-05-08 NOTE — Progress Notes (Signed)
Pre visit review using our clinic review tool, if applicable. No additional management support is needed unless otherwise documented below in the visit note. 

## 2015-05-09 ENCOUNTER — Encounter: Payer: Self-pay | Admitting: Gastroenterology

## 2015-05-09 ENCOUNTER — Other Ambulatory Visit (INDEPENDENT_AMBULATORY_CARE_PROVIDER_SITE_OTHER): Payer: Managed Care, Other (non HMO)

## 2015-05-09 DIAGNOSIS — Z0000 Encounter for general adult medical examination without abnormal findings: Secondary | ICD-10-CM | POA: Diagnosis not present

## 2015-05-09 DIAGNOSIS — I10 Essential (primary) hypertension: Secondary | ICD-10-CM | POA: Diagnosis not present

## 2015-05-09 DIAGNOSIS — R748 Abnormal levels of other serum enzymes: Secondary | ICD-10-CM

## 2015-05-09 DIAGNOSIS — Z114 Encounter for screening for human immunodeficiency virus [HIV]: Secondary | ICD-10-CM

## 2015-05-09 LAB — CBC WITH DIFFERENTIAL/PLATELET
BASOS PCT: 0.6 % (ref 0.0–3.0)
Basophils Absolute: 0 10*3/uL (ref 0.0–0.1)
EOS PCT: 2.1 % (ref 0.0–5.0)
Eosinophils Absolute: 0.1 10*3/uL (ref 0.0–0.7)
HCT: 38.9 % (ref 36.0–46.0)
Hemoglobin: 13 g/dL (ref 12.0–15.0)
LYMPHS ABS: 2.5 10*3/uL (ref 0.7–4.0)
Lymphocytes Relative: 41.9 % (ref 12.0–46.0)
MCHC: 33.3 g/dL (ref 30.0–36.0)
MCV: 89.9 fl (ref 78.0–100.0)
MONO ABS: 0.3 10*3/uL (ref 0.1–1.0)
Monocytes Relative: 4.7 % (ref 3.0–12.0)
NEUTROS ABS: 3.1 10*3/uL (ref 1.4–7.7)
NEUTROS PCT: 50.7 % (ref 43.0–77.0)
PLATELETS: 283 10*3/uL (ref 150.0–400.0)
RBC: 4.33 Mil/uL (ref 3.87–5.11)
RDW: 14.2 % (ref 11.5–15.5)
WBC: 6.1 10*3/uL (ref 4.0–10.5)

## 2015-05-09 LAB — LIPID PANEL
CHOLESTEROL: 130 mg/dL (ref 0–200)
HDL: 42.9 mg/dL (ref >39.00)
LDL Cholesterol: 74 mg/dL (ref 0–99)
NONHDL: 87.18
Total CHOL/HDL Ratio: 3
Triglycerides: 67 mg/dL (ref 0.0–149.0)
VLDL: 13.4 mg/dL (ref 0.0–40.0)

## 2015-05-09 LAB — POCT URINALYSIS DIPSTICK
Bilirubin, UA: NEGATIVE
Glucose, UA: NEGATIVE
KETONES UA: NEGATIVE
Leukocytes, UA: NEGATIVE
Nitrite, UA: NEGATIVE
PROTEIN UA: NEGATIVE
SPEC GRAV UA: 1.02
UROBILINOGEN UA: NEGATIVE
pH, UA: 6

## 2015-05-09 LAB — COMPREHENSIVE METABOLIC PANEL
ALK PHOS: 86 U/L (ref 39–117)
ALT: 33 U/L (ref 0–35)
AST: 23 U/L (ref 0–37)
Albumin: 3.8 g/dL (ref 3.5–5.2)
BUN: 14 mg/dL (ref 6–23)
CO2: 27 meq/L (ref 19–32)
Calcium: 9.2 mg/dL (ref 8.4–10.5)
Chloride: 105 mEq/L (ref 96–112)
Creatinine, Ser: 1.24 mg/dL — ABNORMAL HIGH (ref 0.40–1.20)
GFR: 58.83 mL/min — ABNORMAL LOW (ref >60.00)
GLUCOSE: 105 mg/dL — AB (ref 70–99)
POTASSIUM: 3.8 meq/L (ref 3.5–5.1)
SODIUM: 140 meq/L (ref 135–145)
TOTAL PROTEIN: 7.4 g/dL (ref 6.0–8.3)
Total Bilirubin: 0.4 mg/dL (ref 0.2–1.2)

## 2015-05-09 LAB — MICROALBUMIN / CREATININE URINE RATIO
CREATININE, U: 297.8 mg/dL
Microalb Creat Ratio: 0.2 mg/g (ref 0.0–30.0)

## 2015-05-09 LAB — TSH: TSH: 3.01 u[IU]/mL (ref 0.35–4.50)

## 2015-05-10 LAB — HIV ANTIBODY (ROUTINE TESTING W REFLEX): HIV 1&2 Ab, 4th Generation: NONREACTIVE

## 2015-05-12 NOTE — Addendum Note (Signed)
Addended by: Tasia Catchings on: 05/12/2015 04:43 PM   Modules accepted: Orders

## 2015-06-05 ENCOUNTER — Ambulatory Visit (AMBULATORY_SURGERY_CENTER): Payer: Self-pay

## 2015-06-05 VITALS — Ht 67.0 in | Wt 218.0 lb

## 2015-06-05 DIAGNOSIS — Z1211 Encounter for screening for malignant neoplasm of colon: Secondary | ICD-10-CM

## 2015-06-05 NOTE — Progress Notes (Signed)
No egg or soy allergies Not on home 02 No previous anesthesia complications No diet or weight loss meds 

## 2015-06-12 ENCOUNTER — Ambulatory Visit (AMBULATORY_SURGERY_CENTER): Payer: Managed Care, Other (non HMO) | Admitting: Gastroenterology

## 2015-06-12 ENCOUNTER — Encounter: Payer: Self-pay | Admitting: Gastroenterology

## 2015-06-12 VITALS — BP 118/70 | HR 60 | Temp 98.0°F | Resp 16 | Ht 68.75 in | Wt 215.0 lb

## 2015-06-12 DIAGNOSIS — K621 Rectal polyp: Secondary | ICD-10-CM

## 2015-06-12 DIAGNOSIS — K635 Polyp of colon: Secondary | ICD-10-CM | POA: Diagnosis not present

## 2015-06-12 DIAGNOSIS — D129 Benign neoplasm of anus and anal canal: Secondary | ICD-10-CM

## 2015-06-12 DIAGNOSIS — D128 Benign neoplasm of rectum: Secondary | ICD-10-CM

## 2015-06-12 DIAGNOSIS — Z1211 Encounter for screening for malignant neoplasm of colon: Secondary | ICD-10-CM

## 2015-06-12 DIAGNOSIS — D122 Benign neoplasm of ascending colon: Secondary | ICD-10-CM

## 2015-06-12 MED ORDER — SODIUM CHLORIDE 0.9 % IV SOLN: 500.0000 mL | INTRAVENOUS | Status: DC

## 2015-06-12 NOTE — Progress Notes (Signed)
Called to room to assist during endoscopic procedure.  Patient ID and intended procedure confirmed with present staff. Received instructions for my participation in the procedure from the performing physician.  

## 2015-06-12 NOTE — Patient Instructions (Signed)
YOU HAD AN ENDOSCOPIC PROCEDURE TODAY AT THE [facility]:   Refer to the procedure report that was given to you for any specific questions about what was found during the examination.  If the procedure report does not answer your questions, please call your gastroenterologist to clarify.  If you requested that your care partner not be given the details of your procedure findings, then the procedure report has been included in a sealed envelope for you to review at your convenience later.  YOU SHOULD EXPECT: Some feelings of bloating in the abdomen. Passage of more gas than usual.  Walking can help get rid of the air that was put into your GI tract during the procedure and reduce the bloating. If you had a lower endoscopy (such as a colonoscopy or flexible sigmoidoscopy) you may notice spotting of blood in your stool or on the toilet paper. If you underwent a bowel prep for your procedure, you may not have a normal bowel movement for a few days.  Please Note:  You might notice some irritation and congestion in your nose or some drainage.  This is from the oxygen used during your procedure.  There is no need for concern and it should clear up in a day or so.  SYMPTOMS TO REPORT IMMEDIATELY:   Following lower endoscopy (colonoscopy or flexible sigmoidoscopy):  Excessive amounts of blood in the stool  Significant tenderness or worsening of abdominal pains  Swelling of the abdomen that is new, acute  Fever of 100F or higher   For urgent or emergent issues, a gastroenterologist can be reached at any hour by calling [phone].   DIET: Your first meal following the procedure should be a small meal and then it is ok to progress to your normal diet. Heavy or fried foods are harder to digest and may make you feel nauseous or bloated.  Likewise, meals heavy in dairy and vegetables can increase bloating.  Drink plenty of fluids but you should avoid alcoholic beverages for 24 hours. Try to  increase the fiber in your diet.  ACTIVITY:  You should plan to take it easy for the rest of today and you should NOT DRIVE or use heavy machinery until tomorrow (because of the sedation medicines used during the test).    FOLLOW UP: Our staff will call the number listed on your records the next business day following your procedure to check on you and address any questions or concerns that you may have regarding the information given to you following your procedure. If we do not reach you, we will leave a message.  However, if you are feeling well and you are not experiencing any problems, there is no need to return our call.  We will assume that you have returned to your regular daily activities without incident.  If any biopsies were taken you will be contacted by phone or by letter within the next 1-3 weeks.  Please call us at [phone] if you have not heard about the biopsies in 3 weeks.    SIGNATURES/CONFIDENTIALITY: You and/or your care partner have signed paperwork which will be entered into your electronic medical record.  These signatures attest to the fact that that the information above on your After Visit Summary has been reviewed and is understood.  Full responsibility of the confidentiality of this discharge information lies with you and/or your care-partner.  Try to read all of the handouts given to you by your recovery room nurse. 

## 2015-06-12 NOTE — Op Note (Signed)
[facility]  [address] [place], [id number]   COLONOSCOPY PROCEDURE REPORT  PATIENT: Tamara Morton, Tamara Morton  MR#: [id number] BIRTHDATE: [birth year] , 49  yrs. old GENDER: female ENDOSCOPIST: Wilfrid Lund, MD REFERRED [id number] [place], DO PROCEDURE DATE:  06/12/2015 PROCEDURE:   Colonoscopy, screening and Colonoscopy with biopsy First Screening Colonoscopy - Avg.  risk and is 50 yrs.  old or older Yes.  Prior Negative Screening - Now for repeat screening. N/A  History of Adenoma - Now for follow-up colonoscopy & has been > or = to 3 yrs.  N/A  Polyps removed today? Yes ASA CLASS:   Class I INDICATIONS:Screening for colonic neoplasia and Colorectal Neoplasm Risk Assessment for this procedure is average risk. MEDICATIONS: Monitored anesthesia care, Propofol 300 mg IV, and Lidocaine 40 mg IV  DESCRIPTION OF PROCEDURE:   After the risks benefits and alternatives of the procedure were thoroughly explained, informed consent was obtained.  The digital rectal exam      The LB [id number]  endoscope was introduced through the anus and advanced to the cecum, which was identified by both the appendix and ileocecal valve. No adverse events experienced.   The quality of the prep was excellent.  (Suprep was used)  The instrument was then slowly withdrawn as the colon was fully examined. Estimated blood loss is zero unless otherwise noted in this procedure report.      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  The opening was medium sized.   A sessile polyp measuring 2 mm in size was found in the ascending colon.  A polypectomy was performed with cold forceps.  The resection was complete, the polyp tissue was completely retrieved and sent to histology.   A sessile polyp measuring 2 mm in size was found in the rectum.  A polypectomy was performed with cold forceps.  The resection was complete, the polyp tissue was completely retrieved and sent to  histology.  Retroflexed views revealed no abnormalities. The time to cecum = 3.2 Withdrawal time = 10.1   The scope was withdrawn and the procedure completed. COMPLICATIONS: There were no immediate complications.  ENDOSCOPIC IMPRESSION: 1.   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   Sessile polyp was found in the ascending colon; polypectomy was performed with cold forceps 3.   Sessile polyp was found in the rectum; polypectomy was performed with cold forceps  RECOMMENDATIONS: Repeat colonoscopy in 5 years if polyps adenomatous; otherwise 10 years  eSigned:  Wilfrid Lund, MD 06/12/2015 9:48 AM   cc:   PATIENT NAME:  [name], [name] MR#: [id number]

## 2015-06-12 NOTE — Progress Notes (Signed)
Stable to RR 

## 2015-06-13 ENCOUNTER — Telehealth: Payer: Self-pay

## 2015-06-13 NOTE — Telephone Encounter (Signed)
  Follow up Call-  Call back number 06/12/2015  Post procedure Call Back phone  # [phone] cell  Permission to leave phone message Yes    Patient was called for follow up after her procedure on 06/12/2015. No answer at the number given for follow up phone call. A message was left on voice mail.

## 2015-06-18 ENCOUNTER — Encounter: Payer: Self-pay | Admitting: Gastroenterology

## 2015-11-06 ENCOUNTER — Ambulatory Visit: Payer: Managed Care, Other (non HMO) | Admitting: Family Medicine

## 2015-11-11 ENCOUNTER — Ambulatory Visit (INDEPENDENT_AMBULATORY_CARE_PROVIDER_SITE_OTHER): Payer: Managed Care, Other (non HMO) | Admitting: Family Medicine

## 2015-11-11 ENCOUNTER — Encounter: Payer: Self-pay | Admitting: Family Medicine

## 2015-11-11 VITALS — BP 116/82 | HR 52 | Temp 98.2°F | Ht 69.0 in | Wt 214.0 lb

## 2015-11-11 DIAGNOSIS — I10 Essential (primary) hypertension: Secondary | ICD-10-CM | POA: Diagnosis not present

## 2015-11-11 DIAGNOSIS — M25561 Pain in right knee: Secondary | ICD-10-CM | POA: Diagnosis not present

## 2015-11-11 LAB — BASIC METABOLIC PANEL
BUN: 15 mg/dL (ref 6–23)
CHLORIDE: 106 meq/L (ref 96–112)
CO2: 26 meq/L (ref 19–32)
CREATININE: 1.2 mg/dL (ref 0.40–1.20)
Calcium: 9.6 mg/dL (ref 8.4–10.5)
GFR: 60.97 mL/min (ref >60.00)
Glucose, Bld: 73 mg/dL (ref 70–99)
Potassium: 3.6 mEq/L (ref 3.5–5.1)
SODIUM: 141 meq/L (ref 135–145)

## 2015-11-11 MED ORDER — SPIRONOLACTONE 25 MG PO TABS: 25.0000 mg | ORAL_TABLET | Freq: Every day | ORAL | 3 refills | Status: DC

## 2015-11-11 MED ORDER — DILTIAZEM HCL ER COATED BEADS 240 MG PO CP24: 240.0000 mg | ORAL_CAPSULE | Freq: Every day | ORAL | 3 refills | Status: DC

## 2015-11-11 NOTE — Assessment & Plan Note (Signed)
Stable con't meds 

## 2015-11-11 NOTE — Progress Notes (Signed)
Patient ID: Tamara Morton, female    DOB: Sep 18, 1965  Age: 50 y.o. MRN: [id number]    Subjective:  Subjective  HPI Tamara Morton presents for bp f/u. No complaints except for r knee pain.  No known injury.    Review of Systems  Constitutional: Negative for appetite change, diaphoresis, fatigue and unexpected weight change.  Eyes: Negative for pain, redness and visual disturbance.  Respiratory: Negative for cough, chest tightness, shortness of breath and wheezing.   Cardiovascular: Negative for chest pain, palpitations and leg swelling.  Endocrine: Negative for cold intolerance, heat intolerance, polydipsia, polyphagia and polyuria.  Genitourinary: Negative for difficulty urinating, dysuria and frequency.  Musculoskeletal: Positive for arthralgias.       R knee pain  Neurological: Negative for dizziness, light-headedness, numbness and headaches.    History Past Medical History:  Diagnosis Date  . Chicken pox   . History of stomach ulcers   . Hypertension     She has a past surgical history that includes Foot surgery (Right, "About 6-7 yrs ago") and Abdominal hysterectomy (2009).   Her family history includes Colon cancer in her maternal uncle; Diabetes in her maternal aunt and maternal grandmother; Glaucoma in her mother; Gout in her mother; Heart disease in her father; Hypertension in her father and mother; Ovarian cancer in her maternal aunt; Stomach cancer in her maternal aunt.She reports that she has never smoked. She has never used smokeless tobacco. She reports that she drinks alcohol. She reports that she does not use drugs.  No current outpatient prescriptions on file prior to visit.   No current facility-administered medications on file prior to visit.      Objective:  Objective  Physical Exam  Constitutional: She is oriented to person, place, and time. She appears well-developed and well-nourished.  HENT:  Head: Normocephalic and atraumatic.  Eyes: Conjunctivae  and EOM are normal.  Neck: Normal range of motion. Neck supple. No JVD present. Carotid bruit is not present. No thyromegaly present.  Cardiovascular: Normal rate, regular rhythm and normal heart sounds.   No murmur heard. Pulmonary/Chest: Effort normal and breath sounds normal. No respiratory distress. She has no wheezes. She has no rales. She exhibits no tenderness.  Musculoskeletal: She exhibits tenderness. She exhibits no edema.       Right knee: She exhibits no swelling. No tenderness found.       Legs: Neurological: She is alert and oriented to person, place, and time.  Psychiatric: She has a normal mood and affect.  Nursing note and vitals reviewed.  BP 116/82 (BP Location: Left Arm, Patient Position: Sitting, Cuff Size: Large)   Pulse (!) 52   Temp 98.2 F (36.8 C) (Oral)   Ht 5\' 9"  (1.753 m)   Wt 214 lb (97.1 kg)   SpO2 97%   BMI 31.60 kg/m  Wt Readings from Last 3 Encounters:  11/11/15 214 lb (97.1 kg)  06/12/15 215 lb (97.5 kg)  06/05/15 218 lb (98.9 kg)     Lab Results  Component Value Date   WBC 6.1 05/09/2015   HGB 13.0 05/09/2015   HCT 38.9 05/09/2015   PLT 283.0 05/09/2015   GLUCOSE 73 11/11/2015   CHOL 130 05/09/2015   TRIG 67.0 05/09/2015   HDL 42.90 05/09/2015   LDLCALC 74 05/09/2015   ALT 33 05/09/2015   AST 23 05/09/2015   NA 141 11/11/2015   K 3.6 11/11/2015   CL 106 11/11/2015   CREATININE 1.20 11/11/2015   BUN 15  11/11/2015   CO2 26 11/11/2015   TSH 3.01 05/09/2015   MICROALBUR <0.7 05/09/2015    Dg Chest 2 View  Result Date: 05/28/2010 *RADIOLOGY REPORT* Clinical Data: Cough, bilateral lower lobe rales, question pneumonia. CHEST - 2 VIEW Comparison: [facility] at [address] chest left rib radiographs 11/11/2004. Findings: Low lung volumes seen with lungs clear.  Heart size upper limits of normal left ventricular predominant configuration for degree of inspiration.  Mediastinum, hila, pleura, slight dextroscoliosis mid dorsal  spine and levoscoliosis lumbar spine visualized with osseous structures otherwise unremarkable. IMPRESSION: 1.  Stable slight scoliosis. 2.  No active cardiopulmonary disease. Original Report Authenticated By: Sydnee Levans, M.D.    Assessment & Plan:  Plan  I am having Ms. Ralph maintain her spironolactone and diltiazem.  Meds ordered this encounter  Medications  . spironolactone (ALDACTONE) 25 MG tablet    Sig: Take 1 tablet (25 mg total) by mouth daily.    Dispense:  90 tablet    Refill:  3  . diltiazem (CARDIZEM CD) 240 MG 24 hr capsule    Sig: Take 1 capsule (240 mg total) by mouth daily.    Dispense:  90 capsule    Refill:  3    Problem List Items Addressed This Visit      Unprioritized   HTN (hypertension)    Stable con't meds       Relevant Medications   spironolactone (ALDACTONE) 25 MG tablet   diltiazem (CARDIZEM CD) 240 MG 24 hr capsule   Other Relevant Orders   Basic metabolic panel (Completed)    Other Visit Diagnoses    Right knee pain    -  Primary   Relevant Orders   Ambulatory referral to Sports Medicine      Follow-up: Return in about 6 months (around 05/13/2016) for annual exam, fasting.  Ann Held, DO

## 2015-11-11 NOTE — Patient Instructions (Signed)

## 2015-11-20 ENCOUNTER — Ambulatory Visit: Payer: Managed Care, Other (non HMO) | Admitting: Family Medicine

## 2015-11-25 NOTE — Progress Notes (Signed)
[facility] [place], [place] [address] Phone: [phone] Subjective:    I'm seeing this patient by the request  of:  Ann Held, DO   CC: Right elbow and right knee pain  [id number]  Tamara Morton is a 50 y.o. female coming in with complaint of right elbow and right knee pain. Regarding right knee pain- Patient stated this is new. Within the last month. Does not rib number any true injury but states that it does give her pain. Denies any instability. Rates the severity of pain is 4 out of 10. Denies any swelling. States that going up and down stairs can be difficult.  Regarding right elbow pain-Been more of a chronic problem. Patient denies every 2 injury. Patient states multiple years ago she was given an injection and seems to be beneficial. Patient states that certain activities such as typing a lot seems to give her difficulty. Denies any numbness or weakness. States that it can be sore and keep her up at night. Rates the severity of pain as 5 out of 10. Does respond to over-the-counter medications.     Past Medical History:  Diagnosis Date  . Chicken pox   . History of stomach ulcers   . Hypertension    Past Surgical History:  Procedure Laterality Date  . ABDOMINAL HYSTERECTOMY  2009   uterine fibroids  TAH BSO  . FOOT SURGERY Right "About 6-7 yrs ago"   Social History   Social History  . Marital status: Single    Spouse name: N/A  . Number of children: N/A  . Years of education: N/A   Occupational History  . Radiographer, therapeutic   Social History Main Topics  . Smoking status: Never Smoker  . Smokeless tobacco: Never Used  . Alcohol use 0.0 oz/week     Comment: rare  . Drug use: No  . Sexual activity: No   Other Topics Concern  . Not on file   Social History Narrative   exericse-- no   No Known Allergies Family History  Problem Relation Age of Onset  . Hypertension Mother   .  Gout Mother   . Glaucoma Mother   . Hypertension Father   . Heart disease Father   . Colon cancer Maternal Uncle   . Stomach cancer Maternal Aunt   . Diabetes Maternal Grandmother   . Diabetes Maternal Aunt   . Ovarian cancer Maternal Aunt   . Esophageal cancer Neg Hx   . Rectal cancer Neg Hx     Past medical history, social, surgical and family history all reviewed in electronic medical record.  No pertanent information unless stated regarding to the chief complaint.   Review of Systems: No headache, visual changes, nausea, vomiting, diarrhea, constipation, dizziness, abdominal pain, skin rash, fevers, chills, night sweats, weight loss, swollen lymph nodes, body aches, joint swelling, muscle aches, chest pain, shortness of breath, mood changes.   Objective  There were no vitals taken for this visit.  General: No apparent distress alert and oriented x3 mood and affect normal, dressed appropriately.  HEENT: Pupils equal, extraocular movements intact  Respiratory: Patient's speak in full sentences and does not appear short of breath  Cardiovascular: No lower extremity edema, non tender, no erythema  Skin: Warm dry intact with no signs of infection or rash on extremities or on axial skeleton.  Abdomen: Soft nontender  Neuro: Cranial nerves II through XII are  intact, neurovascularly intact in all extremities with 2+ DTRs and 2+ pulses.  Lymph: No lymphadenopathy of posterior or anterior cervical chain or axillae bilaterally.  Gait normal with good balance and coordination.  MSK:  Non tender with full range of motion and good stability and symmetric strength and tone of shoulders,  wrist, hip, and ankles bilaterally.  Elbow:Right Unremarkable to inspection. Range of motion full pronation, supination, flexion, extension. Strength is full to all of the above directions Stable to varus, valgus stress. Negative moving valgus stress test. Tender over the lateral epicondylar region Ulnar  nerve does not sublux. Negative cubital tunnel Tinel's.  Knee: Right Normal to inspection with no erythema or effusion or obvious bony abnormalities. Palpation normal with no warmth, joint line tenderness, patellar tenderness, or condyle tenderness. ROM full in flexion and extension and lower leg rotation. Mild laxity the LCL Negative Mcmurray's, Apley's, and Thessalonian tests. Non painful patellar compression. Patellar glide without crepitus. Patellar and quadriceps tendons unremarkable. Hamstring and quadriceps strength is normal.  Contralateral knee unremarkable.   Impression and Recommendations:     This case required medical decision making of moderate complexity.      Note: This dictation was prepared with Dragon dictation along with smaller phrase technology. Any transcriptional errors that result from this process are unintentional.

## 2015-11-26 ENCOUNTER — Ambulatory Visit (INDEPENDENT_AMBULATORY_CARE_PROVIDER_SITE_OTHER): Payer: Managed Care, Other (non HMO) | Admitting: Family Medicine

## 2015-11-26 ENCOUNTER — Other Ambulatory Visit: Payer: Self-pay

## 2015-11-26 ENCOUNTER — Encounter: Payer: Self-pay | Admitting: Family Medicine

## 2015-11-26 VITALS — BP 112/80 | HR 60 | Ht 67.0 in | Wt 216.0 lb

## 2015-11-26 DIAGNOSIS — S83421A Sprain of lateral collateral ligament of right knee, initial encounter: Secondary | ICD-10-CM

## 2015-11-26 DIAGNOSIS — M7711 Lateral epicondylitis, right elbow: Secondary | ICD-10-CM

## 2015-11-26 DIAGNOSIS — M25521 Pain in right elbow: Secondary | ICD-10-CM | POA: Diagnosis not present

## 2015-11-26 DIAGNOSIS — S83429A Sprain of lateral collateral ligament of unspecified knee, initial encounter: Secondary | ICD-10-CM | POA: Insufficient documentation

## 2015-11-26 MED ORDER — DICLOFENAC SODIUM 2 % TD SOLN: 2.0000 "application " | Freq: Two times a day (BID) | TRANSDERMAL | 3 refills | Status: DC

## 2015-11-26 NOTE — Assessment & Plan Note (Signed)
Patient likely does have an MCL sprain. Did not give her a brace. We discussed home exercises, which activities to avoid as well as topical anti-inflammatories and icing protocol. If worsening symptoms we'll consider formal physical therapy and bracing but hopefully will think patient will do well with conservative therapy.

## 2015-11-26 NOTE — Patient Instructions (Signed)
Good to see you.  Ice 20 minutes 2 times daily. Usually after activity and before bed. Exercises 3 times a week.  For the wrist wear the brace day and night for 1 week then nightly.  pennsaid pinkie amount topically 2 times daily as needed.  Avoid excessive wrist extension For the knee try the pennsaid and exercises Iron 65mg  daily with 500mg  oc vitamin C See me again in 4 weeks to make sure all better

## 2015-11-26 NOTE — Assessment & Plan Note (Signed)
Lateral Epicondylitis: Elbow anatomy was reviewed, and tendinopathy was explained.  Pt. given a formal rehab program. Series of concentric and eccentric exercises should be done starting with no weight, work up to 1 lb, hammer, etc.  Use counterforce strap if working or using hands.  Formal PT would be beneficial. Emphasized stretching an cross-friction massage Emphasized proper palms up lifting biomechanics to unload ECRB Patient was placed in a wrist brace today. Topical anti-inflammatory's prescribed. In discussing checked her is to avoid. Patient come back and see me again in 3-4 weeks. If worsening symptoms to be a candidate for not glycerin patches and/or formal physical therapy.

## 2016-01-06 ENCOUNTER — Ambulatory Visit: Payer: Managed Care, Other (non HMO) | Admitting: Family Medicine

## 2016-04-09 ENCOUNTER — Other Ambulatory Visit: Payer: Self-pay | Admitting: Family Medicine

## 2016-04-09 DIAGNOSIS — I10 Essential (primary) hypertension: Secondary | ICD-10-CM

## 2016-04-16 ENCOUNTER — Other Ambulatory Visit: Payer: Self-pay | Admitting: Family Medicine

## 2016-04-16 DIAGNOSIS — I10 Essential (primary) hypertension: Secondary | ICD-10-CM

## 2016-04-16 MED ORDER — DILTIAZEM HCL ER COATED BEADS 240 MG PO CP24: 240.0000 mg | ORAL_CAPSULE | Freq: Every day | ORAL | 0 refills | Status: DC

## 2016-04-16 MED ORDER — SPIRONOLACTONE 25 MG PO TABS: 25.0000 mg | ORAL_TABLET | Freq: Every day | ORAL | 0 refills | Status: DC

## 2016-04-16 NOTE — Telephone Encounter (Signed)
Refill done.  

## 2016-04-16 NOTE — Telephone Encounter (Signed)
Caller name: Relationship to patient: Self Can be reached: [phone]  Pharmacy:  [place], [place] to [facility] [phone] (Phone) [phone] (Fax)     Reason for call: Refill spironolactone (ALDACTONE) 25 MG tablet  CARTIA XT 240 MG 24 hr capsule

## 2016-05-14 ENCOUNTER — Ambulatory Visit (INDEPENDENT_AMBULATORY_CARE_PROVIDER_SITE_OTHER): Payer: Managed Care, Other (non HMO) | Admitting: Family Medicine

## 2016-05-14 ENCOUNTER — Encounter: Payer: Self-pay | Admitting: Family Medicine

## 2016-05-14 VITALS — BP 112/80 | HR 52 | Temp 98.2°F | Wt 215.0 lb

## 2016-05-14 DIAGNOSIS — I10 Essential (primary) hypertension: Secondary | ICD-10-CM | POA: Diagnosis not present

## 2016-05-14 DIAGNOSIS — Z0000 Encounter for general adult medical examination without abnormal findings: Secondary | ICD-10-CM | POA: Diagnosis not present

## 2016-05-14 DIAGNOSIS — Z23 Encounter for immunization: Secondary | ICD-10-CM | POA: Diagnosis not present

## 2016-05-14 LAB — LIPID PANEL
Cholesterol: 138 mg/dL (ref 0–200)
HDL: 47 mg/dL (ref >39.00)
LDL CALC: 78 mg/dL (ref 0–99)
NONHDL: 90.7
Total CHOL/HDL Ratio: 3
Triglycerides: 62 mg/dL (ref 0.0–149.0)
VLDL: 12.4 mg/dL (ref 0.0–40.0)

## 2016-05-14 LAB — TSH: TSH: 1.43 u[IU]/mL (ref 0.35–4.50)

## 2016-05-14 LAB — POCT URINALYSIS DIPSTICK
BILIRUBIN UA: NEGATIVE
Blood, UA: NEGATIVE
Glucose, UA: NEGATIVE
KETONES UA: NEGATIVE
LEUKOCYTES UA: NEGATIVE
Nitrite, UA: NEGATIVE
PROTEIN UA: NEGATIVE
SPEC GRAV UA: 1.025
Urobilinogen, UA: NEGATIVE
pH, UA: 6

## 2016-05-14 LAB — CBC WITH DIFFERENTIAL/PLATELET
BASOS ABS: 0 10*3/uL (ref 0.0–0.1)
Basophils Relative: 0.6 % (ref 0.0–3.0)
Eosinophils Absolute: 0.1 10*3/uL (ref 0.0–0.7)
Eosinophils Relative: 1.7 % (ref 0.0–5.0)
HCT: 38.4 % (ref 36.0–46.0)
HEMOGLOBIN: 12.9 g/dL (ref 12.0–15.0)
LYMPHS PCT: 56.2 % — AB (ref 12.0–46.0)
Lymphs Abs: 2.8 10*3/uL (ref 0.7–4.0)
MCHC: 33.7 g/dL (ref 30.0–36.0)
MCV: 91 fl (ref 78.0–100.0)
MONOS PCT: 4.9 % (ref 3.0–12.0)
Monocytes Absolute: 0.2 10*3/uL (ref 0.1–1.0)
Neutro Abs: 1.9 10*3/uL (ref 1.4–7.7)
Platelets: 273 10*3/uL (ref 150.0–400.0)
RBC: 4.22 Mil/uL (ref 3.87–5.11)
RDW: 14 % (ref 11.5–15.5)
WBC: 5.1 10*3/uL (ref 4.0–10.5)

## 2016-05-14 LAB — COMPREHENSIVE METABOLIC PANEL
ALBUMIN: 4.2 g/dL (ref 3.5–5.2)
ALT: 30 U/L (ref 0–35)
AST: 22 U/L (ref 0–37)
Alkaline Phosphatase: 89 U/L (ref 39–117)
BILIRUBIN TOTAL: 0.4 mg/dL (ref 0.2–1.2)
BUN: 16 mg/dL (ref 6–23)
CALCIUM: 9.5 mg/dL (ref 8.4–10.5)
CO2: 27 mEq/L (ref 19–32)
Chloride: 105 mEq/L (ref 96–112)
Creatinine, Ser: 1.17 mg/dL (ref 0.40–1.20)
GFR: 62.66 mL/min (ref >60.00)
Glucose, Bld: 81 mg/dL (ref 70–99)
POTASSIUM: 4.1 meq/L (ref 3.5–5.1)
Sodium: 139 mEq/L (ref 135–145)
TOTAL PROTEIN: 8.1 g/dL (ref 6.0–8.3)

## 2016-05-14 MED ORDER — DILTIAZEM HCL ER COATED BEADS 240 MG PO CP24: 240.0000 mg | ORAL_CAPSULE | Freq: Every day | ORAL | 0 refills | Status: DC

## 2016-05-14 MED ORDER — SPIRONOLACTONE 25 MG PO TABS: 25.0000 mg | ORAL_TABLET | Freq: Every day | ORAL | 0 refills | Status: DC

## 2016-05-14 NOTE — Progress Notes (Signed)
Patient ID: Tamara Morton, female    DOB: 1965-08-03  Age: 51 y.o. MRN: [id number]    Subjective:  Subjective  HPI Tamara Morton presents for f/u bp.  No complaints.   Review of Systems  Constitutional: Negative for appetite change, diaphoresis, fatigue and unexpected weight change.  Eyes: Negative for pain, redness and visual disturbance.  Respiratory: Negative for cough, chest tightness, shortness of breath and wheezing.   Cardiovascular: Negative for chest pain, palpitations and leg swelling.  Endocrine: Negative for cold intolerance, heat intolerance, polydipsia, polyphagia and polyuria.  Genitourinary: Negative for difficulty urinating, dysuria and frequency.  Neurological: Negative for dizziness, light-headedness, numbness and headaches.    History Past Medical History:  Diagnosis Date  . Chicken pox   . History of stomach ulcers   . Hypertension     She has a past surgical history that includes Foot surgery (Right, "About 6-7 yrs ago") and Abdominal hysterectomy (2009).   Her family history includes Colon cancer in her maternal uncle; Diabetes in her maternal aunt and maternal grandmother; Glaucoma in her mother; Gout in her mother; Heart disease in her father; Hypertension in her father and mother; Ovarian cancer in her maternal aunt; Stomach cancer in her maternal aunt.She reports that she has never smoked. She has never used smokeless tobacco. She reports that she drinks alcohol. She reports that she does not use drugs.  No current outpatient prescriptions on file prior to visit.   No current facility-administered medications on file prior to visit.      Objective:  Objective  Physical Exam  Constitutional: She is oriented to person, place, and time. She appears well-developed and well-nourished.  HENT:  Head: Normocephalic and atraumatic.  Eyes: Conjunctivae and EOM are normal.  Neck: Normal range of motion. Neck supple. No JVD present. Carotid bruit is not  present. No thyromegaly present.  Cardiovascular: Normal rate, regular rhythm and normal heart sounds.   No murmur heard. Pulmonary/Chest: Effort normal and breath sounds normal. No respiratory distress. She has no wheezes. She has no rales. She exhibits no tenderness.  Musculoskeletal: She exhibits no edema.  Neurological: She is alert and oriented to person, place, and time.  Psychiatric: She has a normal mood and affect.  Nursing note and vitals reviewed.  BP 112/80 (BP Location: Left Arm, Patient Position: Sitting, Cuff Size: Normal)   Pulse (!) 52   Temp 98.2 F (36.8 C) (Oral)   Wt 215 lb (97.5 kg)   SpO2 99%   BMI 33.67 kg/m  Wt Readings from Last 3 Encounters:  05/14/16 215 lb (97.5 kg)  11/26/15 216 lb (98 kg)  11/11/15 214 lb (97.1 kg)     Lab Results  Component Value Date   WBC 5.1 05/14/2016   HGB 12.9 05/14/2016   HCT 38.4 05/14/2016   PLT 273.0 05/14/2016   GLUCOSE 81 05/14/2016   CHOL 138 05/14/2016   TRIG 62.0 05/14/2016   HDL 47.00 05/14/2016   LDLCALC 78 05/14/2016   ALT 30 05/14/2016   AST 22 05/14/2016   NA 139 05/14/2016   K 4.1 05/14/2016   CL 105 05/14/2016   CREATININE 1.17 05/14/2016   BUN 16 05/14/2016   CO2 27 05/14/2016   TSH 1.43 05/14/2016   MICROALBUR <0.7 05/09/2015    Dg Chest 2 View  Result Date: 05/28/2010 *RADIOLOGY REPORT* Clinical Data: Cough, bilateral lower lobe rales, question pneumonia. CHEST - 2 VIEW Comparison: [facility] at [address] chest left rib radiographs 11/11/2004.  Findings: Low lung volumes seen with lungs clear.  Heart size upper limits of normal left ventricular predominant configuration for degree of inspiration.  Mediastinum, hila, pleura, slight dextroscoliosis mid dorsal spine and levoscoliosis lumbar spine visualized with osseous structures otherwise unremarkable. IMPRESSION: 1.  Stable slight scoliosis. 2.  No active cardiopulmonary disease. Original Report Authenticated By: Tamara Morton,  M.D.    Assessment & Plan:  Plan  I have discontinued Tamara Morton's Diclofenac Sodium. I am also having her maintain her spironolactone and diltiazem.  Meds ordered this encounter  Medications  . spironolactone (ALDACTONE) 25 MG tablet    Sig: Take 1 tablet (25 mg total) by mouth daily.    Dispense:  90 tablet    Refill:  0  . diltiazem (CARTIA XT) 240 MG 24 hr capsule    Sig: Take 1 capsule (240 mg total) by mouth daily.    Dispense:  90 capsule    Refill:  0    Problem List Items Addressed This Visit      Unprioritized   HTN (hypertension) - Primary    con't meds Check labs  rto cpe      Relevant Medications   spironolactone (ALDACTONE) 25 MG tablet   diltiazem (CARTIA XT) 240 MG 24 hr capsule    Other Visit Diagnoses    Preventative health care       Relevant Orders   Comprehensive metabolic panel (Completed)   CBC with Differential/Platelet (Completed)   Lipid panel (Completed)   POCT urinalysis dipstick (Completed)   TSH (Completed)   Needs flu shot       Relevant Orders   Flu Vaccine QUAD 36+ mos PF IM (Fluarix & Fluzone Quad PF) (Completed)      Follow-up: Return in about 3 months (around 08/11/2016), or cpe on a friday.  Ann Held, DO

## 2016-05-14 NOTE — Progress Notes (Signed)
Pre visit review using our clinic review tool, if applicable. No additional management support is needed unless otherwise documented below in the visit note. 

## 2016-05-14 NOTE — Assessment & Plan Note (Signed)
con't meds Check labs  rto cpe

## 2016-05-14 NOTE — Patient Instructions (Signed)
Hypertension Hypertension, commonly called high blood pressure, is when the force of blood pumping through your arteries is too strong. Your arteries are the blood vessels that carry blood from your heart throughout your body. A blood pressure reading consists of a higher number over a lower number, such as 110/72. The higher number (systolic) is the pressure inside your arteries when your heart pumps. The lower number (diastolic) is the pressure inside your arteries when your heart relaxes. Ideally you want your blood pressure below 120/80. Hypertension forces your heart to work harder to pump blood. Your arteries may become narrow or stiff. Having untreated or uncontrolled hypertension can cause heart attack, stroke, kidney disease, and other problems. What increases the risk? Some risk factors for high blood pressure are controllable. Others are not. Risk factors you cannot control include:  Race. You may be at higher risk if you are African American.  Age. Risk increases with age.  Gender. Men are at higher risk than women before age 45 years. After age 65, women are at higher risk than men. Risk factors you can control include:  Not getting enough exercise or physical activity.  Being overweight.  Getting too much fat, sugar, calories, or salt in your diet.  Drinking too much alcohol. What are the signs or symptoms? Hypertension does not usually cause signs or symptoms. Extremely high blood pressure (hypertensive crisis) may cause headache, anxiety, shortness of breath, and nosebleed. How is this diagnosed? To check if you have hypertension, your health care provider will measure your blood pressure while you are seated, with your arm held at the level of your heart. It should be measured at least twice using the same arm. Certain conditions can cause a difference in blood pressure between your right and left arms. A blood pressure reading that is higher than normal on one occasion does  not mean that you need treatment. If it is not clear whether you have high blood pressure, you may be asked to return on a different day to have your blood pressure checked again. Or, you may be asked to monitor your blood pressure at home for 1 or more weeks. How is this treated? Treating high blood pressure includes making lifestyle changes and possibly taking medicine. Living a healthy lifestyle can help lower high blood pressure. You may need to change some of your habits. Lifestyle changes may include:  Following the DASH diet. This diet is high in fruits, vegetables, and whole grains. It is low in salt, red meat, and added sugars.  Keep your sodium intake below 2,300 mg per day.  Getting at least 30-45 minutes of aerobic exercise at least 4 times per week.  Losing weight if necessary.  Not smoking.  Limiting alcoholic beverages.  Learning ways to reduce stress. Your health care provider may prescribe medicine if lifestyle changes are not enough to get your blood pressure under control, and if one of the following is true:  You are 18-59 years of age and your systolic blood pressure is above 140.  You are 60 years of age or older, and your systolic blood pressure is above 150.  Your diastolic blood pressure is above 90.  You have diabetes, and your systolic blood pressure is over 140 or your diastolic blood pressure is over 90.  You have kidney disease and your blood pressure is above 140/90.  You have heart disease and your blood pressure is above 140/90. Your personal target blood pressure may vary depending on your medical   conditions, your age, and other factors. Follow these instructions at home:  Have your blood pressure rechecked as directed by your health care provider.  Take medicines only as directed by your health care provider. Follow the directions carefully. Blood pressure medicines must be taken as prescribed. The medicine does not work as well when you skip  doses. Skipping doses also puts you at risk for problems.  Do not smoke.  Monitor your blood pressure at home as directed by your health care provider. Contact a health care provider if:  You think you are having a reaction to medicines taken.  You have recurrent headaches or feel dizzy.  You have swelling in your ankles.  You have trouble with your vision. Get help right away if:  You develop a severe headache or confusion.  You have unusual weakness, numbness, or feel faint.  You have severe chest or abdominal pain.  You vomit repeatedly.  You have trouble breathing. This information is not intended to replace advice given to you by your health care provider. Make sure you discuss any questions you have with your health care provider. Document Released: 03/29/2005 Document Revised: 09/04/2015 Document Reviewed: 01/19/2013 Elsevier Interactive Patient Education  2017 Elsevier Inc.  

## 2016-06-18 ENCOUNTER — Encounter: Payer: Self-pay | Admitting: Family Medicine

## 2016-06-18 ENCOUNTER — Ambulatory Visit (INDEPENDENT_AMBULATORY_CARE_PROVIDER_SITE_OTHER): Payer: Managed Care, Other (non HMO) | Admitting: Family Medicine

## 2016-06-18 VITALS — BP 127/83 | HR 63 | Temp 98.4°F | Ht 67.0 in | Wt 215.4 lb

## 2016-06-18 DIAGNOSIS — I10 Essential (primary) hypertension: Secondary | ICD-10-CM

## 2016-06-18 DIAGNOSIS — Z0000 Encounter for general adult medical examination without abnormal findings: Secondary | ICD-10-CM

## 2016-06-18 MED ORDER — SPIRONOLACTONE 25 MG PO TABS: 25.0000 mg | ORAL_TABLET | Freq: Every day | ORAL | 0 refills | Status: DC

## 2016-06-18 MED ORDER — DILTIAZEM HCL ER COATED BEADS 240 MG PO CP24: 240.0000 mg | ORAL_CAPSULE | Freq: Every day | ORAL | 0 refills | Status: DC

## 2016-06-18 NOTE — Patient Instructions (Signed)

## 2016-06-18 NOTE — Progress Notes (Signed)
Pre visit review using our clinic review tool, if applicable. No additional management support is needed unless otherwise documented below in the visit note. 

## 2016-06-18 NOTE — Progress Notes (Signed)
Patient ID: Tamara Morton, female   DOB: 07-09-1965, 51 y.o.   MRN: [id number]   Subjective:  I acted as a Education administrator for [employer], DO. Raiford [place], [place]   Patient ID: Tamara Morton, female    DOB: 08/29/1965, 51 y.o.   MRN: [id number]  Chief Complaint  Patient presents with  . Annual Exam  . Hypertension    Hypertension  This is a chronic problem. The problem is controlled. Pertinent negatives include no blurred vision, chest pain, headaches, malaise/fatigue, palpitations or shortness of breath.    Patient is in today for an annual examination. Patient has a Hx of HTN. Patient has no acute concern noted.  Patient Care Team: Ann Held, DO as PCP - General (Family Medicine)   Past Medical History:  Diagnosis Date  . Chicken pox   . History of stomach ulcers   . Hypertension     Past Surgical History:  Procedure Laterality Date  . ABDOMINAL HYSTERECTOMY  2009   uterine fibroids  TAH BSO  . FOOT SURGERY Right "About 6-7 yrs ago"    Family History  Problem Relation Age of Onset  . Hypertension Mother   . Gout Mother   . Glaucoma Mother   . Hypertension Father   . Heart disease Father   . Hematuria Father   . Colon cancer Maternal Uncle   . Stomach cancer Maternal Aunt   . Diabetes Maternal Grandmother   . Diabetes Maternal Aunt   . Ovarian cancer Maternal Aunt   . Esophageal cancer Neg Hx   . Rectal cancer Neg Hx     Social History   Social History  . Marital status: Single    Spouse name: N/A  . Number of children: N/A  . Years of education: N/A   Occupational History  . Radiographer, therapeutic   Social History Main Topics  . Smoking status: Never Smoker  . Smokeless tobacco: Never Used  . Alcohol use 0.0 oz/week     Comment: rare  . Drug use: No  . Sexual activity: No   Other Topics Concern  . Not on file   Social History Narrative   exericse-- no    Outpatient Medications Prior to Visit  Medication Sig Dispense  Refill  . diltiazem (CARTIA XT) 240 MG 24 hr capsule Take 1 capsule (240 mg total) by mouth daily. 90 capsule 0  . spironolactone (ALDACTONE) 25 MG tablet Take 1 tablet (25 mg total) by mouth daily. 90 tablet 0   No facility-administered medications prior to visit.     No Known Allergies  Review of Systems  Constitutional: Negative for fever and malaise/fatigue.  HENT: Negative for congestion.   Eyes: Negative for blurred vision.  Respiratory: Negative for cough and shortness of breath.   Cardiovascular: Negative for chest pain, palpitations and leg swelling.  Gastrointestinal: Negative for vomiting.  Musculoskeletal: Negative for back pain.  Skin: Negative for rash.  Neurological: Negative for loss of consciousness and headaches.       Objective:    Physical Exam  Constitutional: She is oriented to person, place, and time. She appears well-developed and well-nourished. No distress.  HENT:  Head: Normocephalic and atraumatic.  Eyes: Conjunctivae are normal.  Neck: Normal range of motion. No thyromegaly present.  Cardiovascular: Normal rate and regular rhythm.   Pulmonary/Chest: Effort normal and breath sounds normal. She has no wheezes.  Abdominal: Soft. Bowel sounds are normal. There is no tenderness.  Musculoskeletal: She exhibits no edema or deformity.  Neurological: She is alert and oriented to person, place, and time.  Skin: Skin is warm and dry. She is not diaphoretic.  Psychiatric: She has a normal mood and affect.    BP 127/83 (BP Location: Left Arm, Patient Position: Sitting, Cuff Size: Large)   Pulse 63   Temp 98.4 F (36.9 C) (Oral)   Ht 5\' 7"  (1.702 m)   Wt 215 lb 6.4 oz (97.7 kg)   SpO2 100% Comment: RA  BMI 33.74 kg/m  Wt Readings from Last 3 Encounters:  06/18/16 215 lb 6.4 oz (97.7 kg)  05/14/16 215 lb (97.5 kg)  11/26/15 216 lb (98 kg)     Lab Results  Component Value Date   WBC 5.1 05/14/2016   HGB 12.9 05/14/2016   HCT 38.4 05/14/2016    PLT 273.0 05/14/2016   GLUCOSE 81 05/14/2016   CHOL 138 05/14/2016   TRIG 62.0 05/14/2016   HDL 47.00 05/14/2016   LDLCALC 78 05/14/2016   ALT 30 05/14/2016   AST 22 05/14/2016   NA 139 05/14/2016   K 4.1 05/14/2016   CL 105 05/14/2016   CREATININE 1.17 05/14/2016   BUN 16 05/14/2016   CO2 27 05/14/2016   TSH 1.43 05/14/2016   MICROALBUR <0.7 05/09/2015    Lab Results  Component Value Date   TSH 1.43 05/14/2016   Lab Results  Component Value Date   WBC 5.1 05/14/2016   HGB 12.9 05/14/2016   HCT 38.4 05/14/2016   MCV 91.0 05/14/2016   PLT 273.0 05/14/2016   Lab Results  Component Value Date   NA 139 05/14/2016   K 4.1 05/14/2016   CO2 27 05/14/2016   GLUCOSE 81 05/14/2016   BUN 16 05/14/2016   CREATININE 1.17 05/14/2016   BILITOT 0.4 05/14/2016   ALKPHOS 89 05/14/2016   AST 22 05/14/2016   ALT 30 05/14/2016   PROT 8.1 05/14/2016   ALBUMIN 4.2 05/14/2016   CALCIUM 9.5 05/14/2016   GFR 62.66 05/14/2016   Lab Results  Component Value Date   CHOL 138 05/14/2016   Lab Results  Component Value Date   HDL 47.00 05/14/2016   Lab Results  Component Value Date   LDLCALC 78 05/14/2016   Lab Results  Component Value Date   TRIG 62.0 05/14/2016   Lab Results  Component Value Date   CHOLHDL 3 05/14/2016   No results found for: HGBA1C     Assessment & Plan:   Problem List Items Addressed This Visit      Unprioritized   HTN (hypertension)   Relevant Medications   diltiazem (CARTIA XT) 240 MG 24 hr capsule   spironolactone (ALDACTONE) 25 MG tablet   Preventative health care - Primary    ghm utd Check labs See AVS         I am having Ms. Bert maintain her diltiazem and spironolactone.  Meds ordered this encounter  Medications  . diltiazem (CARTIA XT) 240 MG 24 hr capsule    Sig: Take 1 capsule (240 mg total) by mouth daily.    Dispense:  90 capsule    Refill:  0  . spironolactone (ALDACTONE) 25 MG tablet    Sig: Take 1 tablet (25 mg total)  by mouth daily.    Dispense:  90 tablet    Refill:  0    CMA served as scribe during this visit. History, Physical and Plan performed by medical provider. Documentation and orders  reviewed and attested to.  Ann Held, DO

## 2016-06-20 DIAGNOSIS — Z0000 Encounter for general adult medical examination without abnormal findings: Secondary | ICD-10-CM | POA: Insufficient documentation

## 2016-06-20 NOTE — Assessment & Plan Note (Signed)
ghm utd Check labs See AVS 

## 2016-07-05 ENCOUNTER — Other Ambulatory Visit: Payer: Self-pay | Admitting: Family Medicine

## 2016-07-05 DIAGNOSIS — I10 Essential (primary) hypertension: Secondary | ICD-10-CM

## 2016-10-03 ENCOUNTER — Other Ambulatory Visit: Payer: Self-pay | Admitting: Family Medicine

## 2016-10-03 DIAGNOSIS — I10 Essential (primary) hypertension: Secondary | ICD-10-CM

## 2017-01-01 ENCOUNTER — Other Ambulatory Visit: Payer: Self-pay | Admitting: Family Medicine

## 2017-01-01 DIAGNOSIS — I10 Essential (primary) hypertension: Secondary | ICD-10-CM

## 2017-06-24 ENCOUNTER — Ambulatory Visit (INDEPENDENT_AMBULATORY_CARE_PROVIDER_SITE_OTHER): Payer: Managed Care, Other (non HMO) | Admitting: Family Medicine

## 2017-06-24 ENCOUNTER — Encounter: Payer: Self-pay | Admitting: Family Medicine

## 2017-06-24 VITALS — BP 134/84 | HR 75 | Temp 97.7°F | Resp 16 | Ht 66.93 in | Wt 214.2 lb

## 2017-06-24 DIAGNOSIS — Z800 Family history of malignant neoplasm of digestive organs: Secondary | ICD-10-CM | POA: Diagnosis not present

## 2017-06-24 DIAGNOSIS — Z0000 Encounter for general adult medical examination without abnormal findings: Secondary | ICD-10-CM | POA: Diagnosis not present

## 2017-06-24 DIAGNOSIS — I10 Essential (primary) hypertension: Secondary | ICD-10-CM

## 2017-06-24 LAB — CBC WITH DIFFERENTIAL/PLATELET
Basophils Absolute: 0 10*3/uL (ref 0.0–0.1)
Basophils Relative: 0.2 % (ref 0.0–3.0)
EOS PCT: 2.4 % (ref 0.0–5.0)
Eosinophils Absolute: 0.1 10*3/uL (ref 0.0–0.7)
HEMATOCRIT: 41.6 % (ref 36.0–46.0)
HEMOGLOBIN: 14 g/dL (ref 12.0–15.0)
LYMPHS PCT: 53.6 % — AB (ref 12.0–46.0)
Lymphs Abs: 2.6 10*3/uL (ref 0.7–4.0)
MCHC: 33.5 g/dL (ref 30.0–36.0)
MCV: 92.1 fl (ref 78.0–100.0)
MONO ABS: 0.2 10*3/uL (ref 0.1–1.0)
Monocytes Relative: 4 % (ref 3.0–12.0)
Neutro Abs: 1.9 10*3/uL (ref 1.4–7.7)
Neutrophils Relative %: 39.8 % — ABNORMAL LOW (ref 43.0–77.0)
Platelets: 281 10*3/uL (ref 150.0–400.0)
RBC: 4.52 Mil/uL (ref 3.87–5.11)
RDW: 14.2 % (ref 11.5–15.5)
WBC: 4.9 10*3/uL (ref 4.0–10.5)

## 2017-06-24 LAB — LIPID PANEL
CHOLESTEROL: 138 mg/dL (ref 0–200)
HDL: 51.5 mg/dL (ref >39.00)
LDL Cholesterol: 75 mg/dL (ref 0–99)
NonHDL: 86
Total CHOL/HDL Ratio: 3
Triglycerides: 53 mg/dL (ref 0.0–149.0)
VLDL: 10.6 mg/dL (ref 0.0–40.0)

## 2017-06-24 LAB — COMPREHENSIVE METABOLIC PANEL
ALBUMIN: 4.2 g/dL (ref 3.5–5.2)
ALT: 32 U/L (ref 0–35)
AST: 24 U/L (ref 0–37)
Alkaline Phosphatase: 91 U/L (ref 39–117)
BILIRUBIN TOTAL: 0.3 mg/dL (ref 0.2–1.2)
BUN: 13 mg/dL (ref 6–23)
CO2: 29 mEq/L (ref 19–32)
CREATININE: 1.18 mg/dL (ref 0.40–1.20)
Calcium: 10.1 mg/dL (ref 8.4–10.5)
Chloride: 105 mEq/L (ref 96–112)
GFR: 61.77 mL/min (ref >60.00)
Glucose, Bld: 108 mg/dL — ABNORMAL HIGH (ref 70–99)
Potassium: 3.9 mEq/L (ref 3.5–5.1)
SODIUM: 140 meq/L (ref 135–145)
Total Protein: 8.1 g/dL (ref 6.0–8.3)

## 2017-06-24 LAB — TSH: TSH: 2.52 u[IU]/mL (ref 0.35–4.50)

## 2017-06-24 NOTE — Assessment & Plan Note (Signed)
ghm utd Check labs  See avs  

## 2017-06-24 NOTE — Assessment & Plan Note (Signed)
Well controlled, no changes to meds. Encouraged heart healthy diet such as the DASH diet and exercise as tolerated.  °

## 2017-06-24 NOTE — Patient Instructions (Signed)
Preventive Care 40-64 Years, Female Preventive care refers to lifestyle choices and visits with your health care provider that can promote health and wellness. What does preventive care include?  A yearly physical exam. This is also called an annual well check.  Dental exams once or twice a year.  Routine eye exams. Ask your health care provider how often you should have your eyes checked.  Personal lifestyle choices, including: ? Daily care of your teeth and gums. ? Regular physical activity. ? Eating a healthy diet. ? Avoiding tobacco and drug use. ? Limiting alcohol use. ? Practicing safe sex. ? Taking low-dose aspirin daily starting at age 58. ? Taking vitamin and mineral supplements as recommended by your health care provider. What happens during an annual well check? The services and screenings done by your health care provider during your annual well check will depend on your age, overall health, lifestyle risk factors, and family history of disease. Counseling Your health care provider may ask you questions about your:  Alcohol use.  Tobacco use.  Drug use.  Emotional well-being.  Home and relationship well-being.  Sexual activity.  Eating habits.  Work and work Statistician.  Method of birth control.  Menstrual cycle.  Pregnancy history.  Screening You may have the following tests or measurements:  Height, weight, and BMI.  Blood pressure.  Lipid and cholesterol levels. These may be checked every 5 years, or more frequently if you are over 81 years old.  Skin check.  Lung cancer screening. You may have this screening every year starting at age 78 if you have a 30-pack-year history of smoking and currently smoke or have quit within the past 15 years.  Fecal occult blood test (FOBT) of the stool. You may have this test every year starting at age 65.  Flexible sigmoidoscopy or colonoscopy. You may have a sigmoidoscopy every 5 years or a colonoscopy  every 10 years starting at age 30.  Hepatitis C blood test.  Hepatitis B blood test.  Sexually transmitted disease (STD) testing.  Diabetes screening. This is done by checking your blood sugar (glucose) after you have not eaten for a while (fasting). You may have this done every 1-3 years.  Mammogram. This may be done every 1-2 years. Talk to your health care provider about when you should start having regular mammograms. This may depend on whether you have a family history of breast cancer.  BRCA-related cancer screening. This may be done if you have a family history of breast, ovarian, tubal, or peritoneal cancers.  Pelvic exam and Pap test. This may be done every 3 years starting at age 80. Starting at age 36, this may be done every 5 years if you have a Pap test in combination with an HPV test.  Bone density scan. This is done to screen for osteoporosis. You may have this scan if you are at high risk for osteoporosis.  Discuss your test results, treatment options, and if necessary, the need for more tests with your health care provider. Vaccines Your health care provider may recommend certain vaccines, such as:  Influenza vaccine. This is recommended every year.  Tetanus, diphtheria, and acellular pertussis (Tdap, Td) vaccine. You may need a Td booster every 10 years.  Varicella vaccine. You may need this if you have not been vaccinated.  Zoster vaccine. You may need this after age 5.  Measles, mumps, and rubella (MMR) vaccine. You may need at least one dose of MMR if you were born in  1957 or later. You may also need a second dose.  Pneumococcal 13-valent conjugate (PCV13) vaccine. You may need this if you have certain conditions and were not previously vaccinated.  Pneumococcal polysaccharide (PPSV23) vaccine. You may need one or two doses if you smoke cigarettes or if you have certain conditions.  Meningococcal vaccine. You may need this if you have certain  conditions.  Hepatitis A vaccine. You may need this if you have certain conditions or if you travel or work in places where you may be exposed to hepatitis A.  Hepatitis B vaccine. You may need this if you have certain conditions or if you travel or work in places where you may be exposed to hepatitis B.  Haemophilus influenzae type b (Hib) vaccine. You may need this if you have certain conditions.  Talk to your health care provider about which screenings and vaccines you need and how often you need them. This information is not intended to replace advice given to you by your health care provider. Make sure you discuss any questions you have with your health care provider. Document Released: 04/25/2015 Document Revised: 12/17/2015 Document Reviewed: 01/28/2015 Elsevier Interactive Patient Education  2018 Elsevier Inc.  

## 2017-06-24 NOTE — Progress Notes (Signed)
Subjective:  I acted as a Education administrator for [employer]. Tamara Morton, [place]   Patient ID: Tamara Morton, female    DOB: 03/27/1966, 52 y.o.   MRN: [id number]  Chief Complaint  Patient presents with  . Annual Exam    HPI  Patient is in today for annual exam.  No complaints  Patient Care Team: Carollee Herter, Alferd Apa, DO as PCP - General (Family Medicine)   Past Medical History:  Diagnosis Date  . Chicken pox   . History of stomach ulcers   . Hypertension     Past Surgical History:  Procedure Laterality Date  . ABDOMINAL HYSTERECTOMY  2009   uterine fibroids  TAH BSO  . FOOT SURGERY Right "About 6-7 yrs ago"    Family History  Problem Relation Age of Onset  . Hypertension Mother   . Gout Mother   . Glaucoma Mother   . Hypertension Father   . Heart disease Father   . Hematuria Father   . Stomach cancer Father 92  . Colon cancer Maternal Uncle   . Stomach cancer Maternal Aunt   . Diabetes Maternal Grandmother   . Diabetes Maternal Aunt   . Ovarian cancer Maternal Aunt   . Breast cancer Maternal Aunt   . Esophageal cancer Neg Hx   . Rectal cancer Neg Hx     Social History   Socioeconomic History  . Marital status: Single    Spouse name: Not on file  . Number of children: Not on file  . Years of education: Not on file  . Highest education level: Not on file  Social Needs  . Financial resource strain: Not on file  . Food insecurity - worry: Not on file  . Food insecurity - inability: Not on file  . Transportation needs - medical: Not on file  . Transportation needs - non-medical: Not on file  Occupational History  . Occupation: Designer, industrial/product    Comment: [employer]  Tobacco Use  . Smoking status: Never Smoker  . Smokeless tobacco: Never Used  Substance and Sexual Activity  . Alcohol use: Yes    Alcohol/week: 0.0 oz    Comment: rare  . Drug use: No  . Sexual activity: No  Other Topics Concern  . Not on file  Social History Narrative   exericse--  no    Outpatient Medications Prior to Visit  Medication Sig Dispense Refill  . diltiazem (CARDIZEM CD) 240 MG 24 hr capsule TAKE 1 CAPSULE DAILY 90 capsule 1  . diltiazem (CARTIA XT) 240 MG 24 hr capsule Take 1 capsule (240 mg total) by mouth daily. 90 capsule 0  . spironolactone (ALDACTONE) 25 MG tablet Take 1 tablet (25 mg total) by mouth daily. 90 tablet 0  . spironolactone (ALDACTONE) 25 MG tablet TAKE 1 TABLET DAILY 90 tablet 1   No facility-administered medications prior to visit.     No Known Allergies  Review of Systems  Constitutional: Negative for chills, fever and malaise/fatigue.  HENT: Negative for congestion and hearing loss.   Eyes: Negative for discharge.  Respiratory: Negative for cough, sputum production and shortness of breath.   Cardiovascular: Negative for chest pain, palpitations and leg swelling.  Gastrointestinal: Negative for abdominal pain, blood in stool, constipation, diarrhea, heartburn, nausea and vomiting.  Genitourinary: Negative for dysuria, frequency, hematuria and urgency.  Musculoskeletal: Negative for back pain, falls and myalgias.  Skin: Negative for rash.  Neurological: Negative for dizziness, sensory change, loss of consciousness, weakness and headaches.  Endo/Heme/Allergies: Negative for environmental allergies. Does not bruise/bleed easily.  Psychiatric/Behavioral: Negative for depression and suicidal ideas. The patient is not nervous/anxious and does not have insomnia.        Objective:    Physical Exam  Constitutional: She is oriented to person, place, and time. She appears well-developed and well-nourished. No distress.  HENT:  Head: Normocephalic and atraumatic.  Right Ear: External ear normal.  Left Ear: External ear normal.  Nose: Nose normal.  Mouth/Throat: Oropharynx is clear and moist.  Eyes: Conjunctivae and EOM are normal. Pupils are equal, round, and reactive to light. Right eye exhibits no discharge. Left eye exhibits no  discharge.  Neck: Normal range of motion. Neck supple. No JVD present. No thyromegaly present.  Cardiovascular: Normal rate, regular rhythm, normal heart sounds and intact distal pulses.  No murmur heard. Pulmonary/Chest: Effort normal and breath sounds normal. No respiratory distress. She has no wheezes. She has no rales. She exhibits no tenderness.  Abdominal: Soft. Bowel sounds are normal. She exhibits no distension and no mass. There is no tenderness. There is no rebound and no guarding.  Genitourinary: No breast swelling, tenderness, discharge or bleeding. Pelvic exam was performed with patient supine.  Musculoskeletal: Normal range of motion. She exhibits no edema or tenderness.  Lymphadenopathy:    She has no cervical adenopathy.  Neurological: She is alert and oriented to person, place, and time. She has normal reflexes. No cranial nerve deficit.  Skin: Skin is warm and dry. No rash noted. She is not diaphoretic. No erythema.  Psychiatric: She has a normal mood and affect. Her behavior is normal. Judgment and thought content normal.  Nursing note and vitals reviewed.   BP 134/84 (BP Location: Left Arm, Patient Position: Sitting, Cuff Size: Large)   Pulse 75   Temp 97.7 F (36.5 C) (Oral)   Resp 16   Ht 5' 6.93" (1.7 m)   Wt 214 lb 3.2 oz (97.2 kg)   SpO2 96%   BMI 33.62 kg/m  Wt Readings from Last 3 Encounters:  06/24/17 214 lb 3.2 oz (97.2 kg)  06/18/16 215 lb 6.4 oz (97.7 kg)  05/14/16 215 lb (97.5 kg)   BP Readings from Last 3 Encounters:  06/24/17 134/84  06/18/16 127/83  05/14/16 112/80     Immunization History  Administered Date(s) Administered  . Influenza,inj,Quad PF,6+ Mos 05/08/2015, 05/14/2016  . Tdap 05/08/2011    Health Maintenance  Topic Date Due  . MAMMOGRAM  07/31/2016  . INFLUENZA VACCINE  02/24/2018 (Originally 11/10/2016)  . TETANUS/TDAP  05/07/2021  . COLONOSCOPY  06/11/2025  . HIV Screening  Completed    Lab Results  Component Value Date     WBC 5.1 05/14/2016   HGB 12.9 05/14/2016   HCT 38.4 05/14/2016   PLT 273.0 05/14/2016   GLUCOSE 81 05/14/2016   CHOL 138 05/14/2016   TRIG 62.0 05/14/2016   HDL 47.00 05/14/2016   LDLCALC 78 05/14/2016   ALT 30 05/14/2016   AST 22 05/14/2016   NA 139 05/14/2016   K 4.1 05/14/2016   CL 105 05/14/2016   CREATININE 1.17 05/14/2016   BUN 16 05/14/2016   CO2 27 05/14/2016   TSH 1.43 05/14/2016   MICROALBUR <0.7 05/09/2015    Lab Results  Component Value Date   TSH 1.43 05/14/2016   Lab Results  Component Value Date   WBC 5.1 05/14/2016   HGB 12.9 05/14/2016   HCT 38.4 05/14/2016   MCV 91.0 05/14/2016   PLT  273.0 05/14/2016   Lab Results  Component Value Date   NA 139 05/14/2016   K 4.1 05/14/2016   CO2 27 05/14/2016   GLUCOSE 81 05/14/2016   BUN 16 05/14/2016   CREATININE 1.17 05/14/2016   BILITOT 0.4 05/14/2016   ALKPHOS 89 05/14/2016   AST 22 05/14/2016   ALT 30 05/14/2016   PROT 8.1 05/14/2016   ALBUMIN 4.2 05/14/2016   CALCIUM 9.5 05/14/2016   GFR 62.66 05/14/2016   Lab Results  Component Value Date   CHOL 138 05/14/2016   Lab Results  Component Value Date   HDL 47.00 05/14/2016   Lab Results  Component Value Date   LDLCALC 78 05/14/2016   Lab Results  Component Value Date   TRIG 62.0 05/14/2016   Lab Results  Component Value Date   CHOLHDL 3 05/14/2016   No results found for: HGBA1C       Assessment & Plan:   Problem List Items Addressed This Visit      Unprioritized   HTN (hypertension)    Well controlled, no changes to meds. Encouraged heart healthy diet such as the DASH diet and exercise as tolerated.       Relevant Orders   CBC with Differential/Platelet   Comprehensive metabolic panel   Lipid panel   TSH   Preventative health care - Primary    ghm utd Check labs  See avs      Relevant Orders   CBC with Differential/Platelet   Comprehensive metabolic panel   Lipid panel   TSH    Other Visit Diagnoses     Family history- stomach cancer       Relevant Orders   Ambulatory referral to Genetics      I am having Allessandra L. Lipton maintain her diltiazem, spironolactone, spironolactone, and diltiazem.  No orders of the defined types were placed in this encounter.   CMA served as Education administrator during this visit. History, Physical and Plan performed by medical provider. Documentation and orders reviewed and attested to.  Ann Held, DO

## 2017-06-27 ENCOUNTER — Telehealth: Payer: Self-pay | Admitting: Genetics

## 2017-06-27 ENCOUNTER — Encounter: Payer: Self-pay | Admitting: Genetics

## 2017-06-27 NOTE — Telephone Encounter (Signed)
Genetic counseling appt has been scheduled for the pt to see Ferol Luz on 4/29 at 2pm. Pt aware to arrive 30 minutes early. Letter mailed.

## 2017-06-30 ENCOUNTER — Other Ambulatory Visit: Payer: Self-pay | Admitting: Family Medicine

## 2017-06-30 DIAGNOSIS — I10 Essential (primary) hypertension: Secondary | ICD-10-CM

## 2017-08-08 ENCOUNTER — Encounter: Payer: Self-pay | Admitting: Genetics

## 2017-08-08 ENCOUNTER — Inpatient Hospital Stay: Payer: Managed Care, Other (non HMO) | Attending: Genetic Counselor | Admitting: Genetics

## 2017-08-08 ENCOUNTER — Inpatient Hospital Stay: Payer: Managed Care, Other (non HMO)

## 2017-08-08 DIAGNOSIS — Z8041 Family history of malignant neoplasm of ovary: Secondary | ICD-10-CM | POA: Diagnosis not present

## 2017-08-08 DIAGNOSIS — Z800 Family history of malignant neoplasm of digestive organs: Secondary | ICD-10-CM | POA: Diagnosis not present

## 2017-08-08 DIAGNOSIS — Z8042 Family history of malignant neoplasm of prostate: Secondary | ICD-10-CM

## 2017-08-08 DIAGNOSIS — Z803 Family history of malignant neoplasm of breast: Secondary | ICD-10-CM

## 2017-08-08 DIAGNOSIS — Z1379 Encounter for other screening for genetic and chromosomal anomalies: Secondary | ICD-10-CM | POA: Diagnosis not present

## 2017-08-08 NOTE — Progress Notes (Signed)
REFERRING PROVIDER: Ann Held, DO [address] [place], [place] [address]  PRIMARY PROVIDER:  Carollee Herter, Alferd Apa, DO  PRIMARY REASON FOR VISIT:  1. Family history of colon cancer   2. Family history of stomach cancer   3. Family history of prostate cancer   4. Family history of ovarian cancer   5. Family history of breast cancer     HISTORY OF PRESENT ILLNESS:   Tamara Morton, a 52 y.o. female, was seen for a [facility] cancer genetics consultation at the request of Dr. Carollee Herter due to a family history of cancer.  Tamara Morton presents to clinic today to discuss the possibility of a hereditary predisposition to cancer, genetic testing, and to further clarify her future cancer risks, as well as potential cancer risks for family members.   Tamara Morton is a 52 y.o. female with no personal history of cancer.    HORMONAL RISK FACTORS:  Menarche was at age 50/13.  First live birth at age N/A.  OCP use for approximately 0 years.  Ovaries intact: no.  Hysterectomy: yes. 2009 Menopausal status: postmenopausal.  HRT use: 0 years. Colonoscopy: yes; 2017 2 benign polyps, told to come back in 10 years. Mammogram within the last year: no. Number of breast biopsies: 0.  Past Medical History:  Diagnosis Date  . Chicken pox   . Family history of breast cancer   . Family history of colon cancer   . Family history of ovarian cancer   . Family history of prostate cancer   . Family history of stomach cancer   . History of stomach ulcers   . Hypertension     Past Surgical History:  Procedure Laterality Date  . ABDOMINAL HYSTERECTOMY  2009   uterine fibroids  TAH BSO  . FOOT SURGERY Right "About 6-7 yrs ago"    Social History   Socioeconomic History  . Marital status: Single    Spouse name: Not on file  . Number of children: Not on file  . Years of education: Not on file  . Highest education level: Not on file  Occupational History  . Occupation: Chartered loss adjuster    Comment: [employer]  Social Needs  . Financial resource strain: Not on file  . Food insecurity:    Worry: Not on file    Inability: Not on file  . Transportation needs:    Medical: Not on file    Non-medical: Not on file  Tobacco Use  . Smoking status: Never Smoker  . Smokeless tobacco: Never Used  Substance and Sexual Activity  . Alcohol use: Yes    Alcohol/week: 0.0 oz    Comment: rare  . Drug use: No  . Sexual activity: Never  Lifestyle  . Physical activity:    Days per week: Not on file    Minutes per session: Not on file  . Stress: Not on file  Relationships  . Social connections:    Talks on phone: Not on file    Gets together: Not on file    Attends religious service: Not on file    Active member of club or organization: Not on file    Attends meetings of clubs or organizations: Not on file    Relationship status: Not on file  Other Topics Concern  . Not on file  Social History Narrative   exericse-- no     FAMILY HISTORY:  We obtained a detailed, 4-generation family history.  Significant diagnoses are listed below: Family History  Problem Relation Age of Onset  . Hypertension Mother   . Gout Mother   . Glaucoma Mother   . Hypertension Father   . Heart disease Father   . Hematuria Father   . Stomach cancer Father 29  . Prostate cancer Maternal Uncle 61       metastatic  . Colon cancer Maternal Aunt 14  . Diabetes Maternal Grandmother   . Diabetes Maternal Aunt   . Ovarian cancer Maternal Aunt 62  . Breast cancer Maternal Aunt 68  . Lung cancer Maternal Aunt   . Esophageal cancer Neg Hx   . Rectal cancer Neg Hx    Tamara Morton has no children.  Tamara Morton has 2 paternal half-brothers, but does not know any information about these realties' health.   Tamara Morton father: died at 16 due to stomach cancer dx at 17.  Paternal Aunts/Uncles: 1 paternal uncle is deceased, unk cause- he had a daughter.  1 paternal aunt is in her late 21's with no  history of cancer- she has a son and a daughter.   Paternal cousins: no history of cancer.  Paternal grandfather: died at 47, unk cause Paternal grandmother:died in her 63's/50's unk cause.   Tamara Morton mother: is 55 with no history of cancer.  She has her uterus and ovaries intact.   Maternal Aunts/Uncles: 2 maternal uncles, 7 maternal aunts: -1 maternal aunt has lung cancer currently.  She had 2 daughters- 1 died at 64 due to stomach cancer, and the other died at 22 due to cancer- type unk. -1 maternal aunt died due to colon cancer dx in her 72's  -12 maternal aunt is in her late 60's/early 70's and had breast cancer dx at 42.  She has no children.  -1 maternal aunt died of ovarian cancer dx at 35. She had no children.  -2 maternal aunts with no history of cancer- heart disease -1 maternal uncle died of metastatic prostate cancer dx at 82.  -1 maternal uncle died due to heart disease.   Maternal grandfather: deceased, no history of cancer.  Maternal grandmother:deceased, no history of cancer, diabetes  Tamara Morton is unaware of previous family history of genetic testing for hereditary cancer risks. Patient's maternal ancestors are of African American descent, and paternal ancestors are of African American descent. There is no reported Ashkenazi Jewish ancestry. There is no known consanguinity.  GENETIC COUNSELING ASSESSMENT: Tamara Morton is a 52 y.o. female with a family history which is somewhat suggestive of a Hereditary Cancer Predisposition Syndrome. We, therefore, discussed and recommended the following at today's visit.   DISCUSSION: We reviewed the characteristics, features and inheritance patterns of hereditary cancer syndromes. We also discussed genetic testing, including the appropriate family members to test, the process of testing, insurance coverage and turn-around-time for results. We discussed the implications of a negative, positive and/or variant of uncertain significant result.  We recommended Tamara Morton pursue genetic testing for the CancerNext gene panel. The CancerNext gene panel offered by [employer] includes sequencing and rearrangement analysis for the following 34 genes:   APC, ATM, BARD1, BMPR1A, BRCA1, BRCA2, BRIP1, CDH1, CDK4, CDKN2A, CHEK2, DICER1, EPCAM, GREM1, HOXB13, MLH1, MRE11A, MSH2, MSH6, MUTYH, NBN, NF1, PALB2, PMS2, POLD1, POLE, PTEN, RAD50, RAD51D, SMAD4, SMARCA4, STK11, and TP53.   We discussed that only 5-10% of cancers are associated with a Hereditary Cancer Predisposition Syndrome.  The most common hereditary cancer syndrome associated with colon cancer  is Lynch Syndrome.  Lynch Syndrome is caused by mutations in the genes: MLH1, MSH2, MSH6, PMS2 and EPCAM.  This syndrome increases the risk for colon, uterine, ovarian and stomach cancers, as well as others.  Families with Lynch Syndrome tend to have multiple family members with these cancers, typically diagnosed under age 47, and diagnoses in multiple generations.    We also dicussed that there are many other genes that increase the risk for many different types of cancer. (breast, ovarian, colon,  etc)    We discussed that if she is found to have a mutation in one of these genes, it may impact future medical management recommendations such as increased cancer screenings and consideration of risk reducing surgeries.  A positive result could also have implications for the patient's family members.  A Negative result would mean we were unable to identify a hereditary component to her family's cancer, but does not rule out the possibility of a hereditary basis for her family's cancer.  There could be mutations that are undetectable by current technology, or in genes not yet tested or identified to increase cancer risk.    We discussed the potential to find a Variant of Uncertain Significance or VUS.  These are variants that have not yet been identified as pathogenic or benign, and it is unknown if this  variant is associated with increased cancer risk or if this is a normal finding.  Most VUS's are reclassified to benign or likely benign.   It should not be used to make medical management decisions. With time, we suspect the lab will determine the significance of any VUS's identified if any.   Based on Tamara Morton's family history of cancer, she meets medical criteria for genetic testing. Despite that she meets criteria, she may still have an out of pocket cost. We discussed that if her out of pocket cost for testing is over $100, the laboratory will call and confirm whether she wants to proceed with testing.  If the out of pocket cost of testing is less than $100 she will be billed by the genetic testing laboratory.   The [employer] risk model was used to estimate Tamara Morton's risk of developing breast cancer.   This risk model takes into account both hereditary and non-hereditary risk factors (hormone history, density, biopsy history, etc).  This estimate is performed for the outcome of negative genetic testing.  A positive result may impact this risk assessment.    The [employer] model estimates her lifetime breast cancer risk to be about 11.6%.  The American Cancer Society recommends high risk breast screening when a woman's lifetime breast caner risk is >20%.     PLAN: After considering the risks, benefits, and limitations, Tamara Morton  provided informed consent to pursue genetic testing and the blood sample was sent to [facility] for analysis of the CancerNext Panel. Results should be available within approximately 2-3 weeks' time, at which point they will be disclosed by telephone to Tamara Morton, as will any additional recommendations warranted by these results. Tamara Morton will receive a summary of her genetic counseling visit and a copy of her results once available. This information will also be available in Epic. We encouraged Tamara Morton to remain in contact with cancer genetics annually so that we  can continuously update the family history and inform her of any changes in cancer genetics and testing that may be of benefit for her family. Tamara Morton questions were answered to her satisfaction  today. Our contact information was provided should additional questions or concerns arise.  Based on Ms. Minturn's family history, we recommended her maternal relatives, especially those affected with cancer also have genetic counseling and testing. Tamara Morton will let us know if we can be of any assistance in coordinating genetic counseling and/or testing for this family member.   Lastly, we encouraged Ms. Minassian to remain in contact with cancer genetics annually so that we can continuously update the family history and inform her of any changes in cancer genetics and testing that may be of benefit for this family.   Ms.  Palen questions were answered to her satisfaction today. Our contact information was provided should additional questions or concerns arise. Thank you for the referral and allowing [place] to share in the care of your patient.   Tana Felts, MS Genetic Counselor [email or web address] phone: [phone]  The patient was seen for a total of 35 minutes in face-to-face genetic counseling.

## 2017-08-24 ENCOUNTER — Telehealth: Payer: Self-pay | Admitting: Genetics

## 2017-08-26 ENCOUNTER — Ambulatory Visit: Payer: Self-pay | Admitting: Genetics

## 2017-08-26 ENCOUNTER — Encounter: Payer: Self-pay | Admitting: Genetics

## 2017-08-26 DIAGNOSIS — Z1379 Encounter for other screening for genetic and chromosomal anomalies: Secondary | ICD-10-CM

## 2017-08-26 DIAGNOSIS — Z800 Family history of malignant neoplasm of digestive organs: Secondary | ICD-10-CM

## 2017-08-26 DIAGNOSIS — Z8041 Family history of malignant neoplasm of ovary: Secondary | ICD-10-CM

## 2017-08-26 DIAGNOSIS — Z8042 Family history of malignant neoplasm of prostate: Secondary | ICD-10-CM

## 2017-08-26 DIAGNOSIS — Z803 Family history of malignant neoplasm of breast: Secondary | ICD-10-CM

## 2017-08-26 NOTE — Progress Notes (Signed)
HPI:  Tamara Morton was previously seen in the [place] clinic on 08/08/2017 due to a family history of cancer and concerns regarding a hereditary predisposition to cancer. Please refer to our prior cancer genetics clinic note for more information regarding Tamara Morton's medical, social and family histories, and our assessment and recommendations, at the time. Tamara Morton recent genetic test results were disclosed to her, as well as recommendations warranted by these results. These results and recommendations are discussed in more detail below.  CANCER HISTORY:   No history exists.     FAMILY HISTORY:  We obtained a detailed, 4-generation family history.  Significant diagnoses are listed below: Family History  Problem Relation Age of Onset  . Hypertension Mother   . Gout Mother   . Glaucoma Mother   . Hypertension Father   . Heart disease Father   . Hematuria Father   . Stomach cancer Father 82  . Prostate cancer Maternal Uncle 61       metastatic  . Colon cancer Maternal Aunt 21  . Diabetes Maternal Grandmother   . Diabetes Maternal Aunt   . Ovarian cancer Maternal Aunt 62  . Breast cancer Maternal Aunt 68  . Lung cancer Maternal Aunt   . Esophageal cancer Neg Hx   . Rectal cancer Neg Hx     Tamara Morton has no children.  Tamara Morton has 2 paternal half-brothers, but does not know any information about these realties' health.   Tamara Morton father: died at 8 due to stomach cancer dx at 71.  Paternal Aunts/Uncles: 1 paternal uncle is deceased, unk cause- he had a daughter.  1 paternal aunt is in her late 33's with no history of cancer- she has a son and a daughter.   Paternal cousins: no history of cancer.  Paternal grandfather: died at 68, unk cause Paternal grandmother:died in her 82's/50's unk cause.   Tamara Morton mother: is 49 with no history of cancer.  She has her uterus and ovaries intact.   Maternal Aunts/Uncles: 2 maternal uncles, 7 maternal aunts: -1 maternal aunt  has lung cancer currently.  She had 2 daughters- 1 died at 57 due to stomach cancer, and the other died at 75 due to cancer- type unk. -1 maternal aunt died due to colon cancer dx in her 59's  -64 maternal aunt is in her late 60's/early 70's and had breast cancer dx at 23.  She has no children.  -1 maternal aunt died of ovarian cancer dx at 62. She had no children.  -2 maternal aunts with no history of cancer- heart disease -1 maternal uncle died of metastatic prostate cancer dx at 64.  -1 maternal uncle died due to heart disease.   Maternal grandfather: deceased, no history of cancer.  Maternal grandmother:deceased, no history of cancer, diabetes  Tamara Morton is unaware of previous family history of genetic testing for hereditary cancer risks. Patient's maternal ancestors are of African American descent, and paternal ancestors are of African American descent. There is no reported Ashkenazi Jewish ancestry. There is no known consanguinity.  GENETIC TEST RESULTS: Genetic testing performed through [facility] CancerNext Panel reported out on 08/22/2017 showed no pathogenic mutations. The CancerNext gene panel offered by [facility] includes sequencing and rearrangement analysis for the following 34 genes:   APC, ATM, BARD1, BMPR1A, BRCA1, BRCA2, BRIP1, CDH1, CDK4, CDKN2A, CHEK2, DICER1, EPCAM, GREM1, HOXB13MLH1, MRE11A, MSH2, MSH6, MUTYH, NBN, NF1, PALB2, PMS2, POLD1, POLE, PTEN, RAD50, RAD51D, SMAD4, SMARCA4, STK11, and  TP53. .  The test report will be scanned into EPIC and will be located under the Molecular Pathology section of the Results Review tab. A portion of the result report is included below for reference.     We discussed with Tamara Morton that because current genetic testing is not perfect, it is possible there may be a gene mutation in one of these genes that current testing cannot detect, but that chance is small.  We also discussed, that there could be another gene that has not yet been  discovered, or that we have not yet tested, that is responsible for the cancer diagnoses in the family. It is also possible there is a hereditary cause for the cancer in the family that Tamara Morton did not inherit and therefore was not identified in her testing.  Therefore, it is important to remain in touch with cancer genetics in the future so that we can continue to offer Tamara Morton the most up to date genetic testing.   ADDITIONAL GENETIC TESTING: We discussed with Tamara Morton that there are other genes that are associated with increased cancer risk that can be analyzed. The laboratories that offer this testing look at these additional genes via a hereditary cancer gene panel. Should Tamara Morton wish to pursue additional genetic testing, we are happy to discuss and coordinate this testing, at any time.    CANCER SCREENING RECOMMENDATIONS: Tamara Morton test result is negative (normal).  This means that we have not identified a hereditary predisposition to cancer for her.    While reassuring, this does not definitively rule out a hereditary predisposition to cancer. It is still possible that there could be genetic mutations that are undetectable by current technology, or genetic mutations in genes that have not been tested or identified to increase cancer risk.  Therefore, it is recommended she continue to follow the cancer management and screening guidelines provided by her oncology and primary healthcare provider. An individual's cancer risk is not determined by genetic test results alone.  Overall cancer risk assessment includes additional factors such as personal medical history, family history, etc.  These should be used to make a personalized plan for cancer prevention and surveillance.    The [employer] risk model was used to estimate Tamara Morton's risk of developing breast cancer.  This risk model takes into account both hereditary and non-hereditary risk factors (hormone history, density, biopsy history, etc).    This estimate changes as a woman ages, and other factors change.    The [employer] model estimates her lifetime breast cancer risk to be about 11.6%.  The American Cancer Society recommends high risk breast screening when a woman's lifetime breast caner risk is >20%.      RECOMMENDATIONS FOR FAMILY MEMBERS:  Relatives in this family might be at some increased risk of developing cancer, over the general population risk, simply due to the family history of cancer.  We recommended women in this family have a yearly mammogram beginning at age 25, or 68 years younger than the earliest onset of cancer, an annual clinical breast exam, and perform monthly breast self-exams. Women in this family should also have a gynecological exam as recommended by their primary provider. All family members should have a colonoscopy by age 67 (or as directed by their doctors).  All family members should inform their physicians about the family history of cancer so their doctors can make the most appropriate screening recommendations for them.   It is also possible  there is a hereditary cause for the cancer in Ms. Settlemyre's family that she did not inherit and therefore was not identified in her.   We recommended her maternal relatives also have genetic counseling and testing. Ms. Heyde will let us know if we can be of any assistance in coordinating genetic counseling and/or testing for these family members.   FOLLOW-UP: Lastly, we discussed with Ms. Huser that cancer genetics is a rapidly advancing field and it is possible that new genetic tests will be appropriate for her and/or her family members in the future. We encouraged her to remain in contact with cancer genetics on an annual basis so we can update her personal and family histories and let her know of advances in cancer genetics that may benefit this family.   Our contact number was provided. Ms. Shisler questions were answered to her satisfaction, and she knows she is  welcome to call us at anytime with additional questions or concerns.   Ferol Luz, MS, [facility] Certified Genetic Counselor [email or web address]

## 2017-08-26 NOTE — Telephone Encounter (Signed)
Revealed negative genetic testing.   This normal result is reassuring and indicates that it is unlikely Tamara Morton  However, genetic testing is not perfect, and cannot definitively rule out a hereditary cause.  It will be important for her to keep in contact with genetics to learn if any additional testing may be needed in the future.     Recommended maternal relatives also have genetic testing due to family history of multiple cancers.

## 2017-12-19 ENCOUNTER — Telehealth: Payer: Self-pay

## 2017-12-19 DIAGNOSIS — I10 Essential (primary) hypertension: Secondary | ICD-10-CM

## 2017-12-19 NOTE — Telephone Encounter (Signed)
Cartia rx renewal request received from [facility]. Last refilled 06/2016. Last OV with PCP 06/2017. Med list needs reconciliation. Routed to PCP to clarify need for renewal and/or need for OV.

## 2017-12-19 NOTE — Telephone Encounter (Signed)
Ok to refill x1  Pt due for ov this month

## 2017-12-20 MED ORDER — DILTIAZEM HCL ER COATED BEADS 240 MG PO CP24: 240.0000 mg | ORAL_CAPSULE | Freq: Every day | ORAL | 0 refills | Status: DC

## 2017-12-20 NOTE — Telephone Encounter (Signed)
Author phoned pt. to set up an OV appointment per Dr. Etter Sjogren. Cartia refilled X 83month per Dr. Nonda Lou request. No answer, author left detailed VM, asking for call back to schedule OV [phone].

## 2017-12-23 ENCOUNTER — Other Ambulatory Visit: Payer: Self-pay | Admitting: *Deleted

## 2017-12-23 DIAGNOSIS — I10 Essential (primary) hypertension: Secondary | ICD-10-CM

## 2017-12-23 MED ORDER — DILTIAZEM HCL ER COATED BEADS 240 MG PO CP24: 240.0000 mg | ORAL_CAPSULE | Freq: Every day | ORAL | 0 refills | Status: DC

## 2017-12-23 MED ORDER — SPIRONOLACTONE 25 MG PO TABS: 25.0000 mg | ORAL_TABLET | Freq: Every day | ORAL | 0 refills | Status: DC

## 2017-12-30 ENCOUNTER — Encounter: Payer: Self-pay | Admitting: Family Medicine

## 2017-12-30 ENCOUNTER — Ambulatory Visit (INDEPENDENT_AMBULATORY_CARE_PROVIDER_SITE_OTHER): Payer: Managed Care, Other (non HMO) | Admitting: Family Medicine

## 2017-12-30 VITALS — BP 118/79 | HR 71 | Temp 97.9°F | Resp 16 | Ht 67.0 in | Wt 212.4 lb

## 2017-12-30 DIAGNOSIS — I10 Essential (primary) hypertension: Secondary | ICD-10-CM

## 2017-12-30 DIAGNOSIS — Z23 Encounter for immunization: Secondary | ICD-10-CM | POA: Diagnosis not present

## 2017-12-30 LAB — COMPREHENSIVE METABOLIC PANEL
ALBUMIN: 4.1 g/dL (ref 3.5–5.2)
ALT: 30 U/L (ref 0–35)
AST: 22 U/L (ref 0–37)
Alkaline Phosphatase: 93 U/L (ref 39–117)
BILIRUBIN TOTAL: 0.4 mg/dL (ref 0.2–1.2)
BUN: 15 mg/dL (ref 6–23)
CO2: 27 mEq/L (ref 19–32)
Calcium: 9.5 mg/dL (ref 8.4–10.5)
Chloride: 107 mEq/L (ref 96–112)
Creatinine, Ser: 1.23 mg/dL — ABNORMAL HIGH (ref 0.40–1.20)
GFR: 58.77 mL/min — ABNORMAL LOW (ref >60.00)
GLUCOSE: 96 mg/dL (ref 70–99)
Potassium: 4.1 mEq/L (ref 3.5–5.1)
SODIUM: 142 meq/L (ref 135–145)
TOTAL PROTEIN: 7.5 g/dL (ref 6.0–8.3)

## 2017-12-30 LAB — LIPID PANEL
Cholesterol: 141 mg/dL (ref 0–200)
HDL: 43.8 mg/dL (ref >39.00)
LDL Cholesterol: 82 mg/dL (ref 0–99)
NONHDL: 96.88
Total CHOL/HDL Ratio: 3
Triglycerides: 76 mg/dL (ref 0.0–149.0)
VLDL: 15.2 mg/dL (ref 0.0–40.0)

## 2017-12-30 MED ORDER — DILTIAZEM HCL ER COATED BEADS 240 MG PO CP24: 240.0000 mg | ORAL_CAPSULE | Freq: Every day | ORAL | 1 refills | Status: DC

## 2017-12-30 MED ORDER — SPIRONOLACTONE 25 MG PO TABS: 25.0000 mg | ORAL_TABLET | Freq: Every day | ORAL | 1 refills | Status: DC

## 2017-12-30 NOTE — Assessment & Plan Note (Signed)
Well controlled, no changes to meds. Encouraged heart healthy diet such as the DASH diet and exercise as tolerated.  °

## 2017-12-30 NOTE — Progress Notes (Signed)
Patient ID: Tamara Morton, female    DOB: 03-15-1966  Age: 52 y.o. MRN: [id number]    Subjective:  Subjective  HPI Tamara Morton presents for f/u bp    No complaints.   Pt also would like flu vaccine and shingrix   Review of Systems  Constitutional: Negative for activity change, appetite change, chills and fever.  HENT: Negative for congestion and hearing loss.   Eyes: Negative for discharge.  Respiratory: Negative for cough and shortness of breath.   Cardiovascular: Negative for chest pain, palpitations and leg swelling.  Gastrointestinal: Negative for abdominal distention, abdominal pain, blood in stool, constipation, diarrhea, nausea and vomiting.  Genitourinary: Negative for difficulty urinating, dyspareunia, dysuria, flank pain, frequency, genital sores, hematuria, menstrual problem, pelvic pain, urgency, vaginal discharge and vaginal pain.  Musculoskeletal: Negative for back pain and myalgias.  Skin: Negative for rash.  Allergic/Immunologic: Negative for environmental allergies.  Neurological: Negative for dizziness, weakness and headaches.  Hematological: Does not bruise/bleed easily.  Psychiatric/Behavioral: Negative for suicidal ideas. The patient is not nervous/anxious.     History Past Medical History:  Diagnosis Date  . Chicken pox   . Family history of breast cancer   . Family history of colon cancer   . Family history of ovarian cancer   . Family history of prostate cancer   . Family history of stomach cancer   . History of stomach ulcers   . Hypertension     She has a past surgical history that includes Foot surgery (Right, "About 6-7 yrs ago") and Abdominal hysterectomy (2009).   Her family history includes Breast cancer (age of onset: 85) in her maternal aunt; Colon cancer (age of onset: 100) in her maternal aunt; Diabetes in her maternal aunt and maternal grandmother; Glaucoma in her mother; Gout in her mother; Heart disease in her father; Hematuria in her  father; Hypertension in her father and mother; Lung cancer in her maternal aunt; Ovarian cancer (age of onset: 87) in her maternal aunt; Prostate cancer (age of onset: 52) in her maternal uncle; Stomach cancer (age of onset: 65) in her father.She reports that she has never smoked. She has never used smokeless tobacco. She reports that she drinks alcohol. She reports that she does not use drugs.  No current outpatient medications on file prior to visit.   No current facility-administered medications on file prior to visit.      Objective:  Objective  Physical Exam  Constitutional: She is oriented to person, place, and time. She appears well-developed and well-nourished. No distress.  HENT:  Head: Normocephalic and atraumatic.  Right Ear: External ear normal.  Left Ear: External ear normal.  Nose: Nose normal.  Mouth/Throat: Oropharynx is clear and moist.  Eyes: Pupils are equal, round, and reactive to light. Conjunctivae and EOM are normal.  Neck: Normal range of motion. Neck supple. No JVD present. Carotid bruit is not present. No thyromegaly present.  Cardiovascular: Normal rate, regular rhythm and normal heart sounds.  No murmur heard. Pulmonary/Chest: Effort normal and breath sounds normal. No respiratory distress. She has no wheezes. She has no rales. She exhibits no tenderness.  Musculoskeletal: She exhibits no edema.  Neurological: She is alert and oriented to person, place, and time.  Psychiatric: She has a normal mood and affect. Her behavior is normal. Judgment and thought content normal.  Nursing note and vitals reviewed.  BP 118/79 (BP Location: Right Arm, Cuff Size: Large)   Pulse 71   Temp 97.9 F (36.6  C) (Axillary)   Resp 16   Ht 5\' 7"  (1.702 m)   Wt 212 lb 6.4 oz (96.3 kg)   SpO2 99%   BMI 33.27 kg/m  Wt Readings from Last 3 Encounters:  12/30/17 212 lb 6.4 oz (96.3 kg)  06/24/17 214 lb 3.2 oz (97.2 kg)  06/18/16 215 lb 6.4 oz (97.7 kg)     Lab Results    Component Value Date   WBC 4.9 06/24/2017   HGB 14.0 06/24/2017   HCT 41.6 06/24/2017   PLT 281.0 06/24/2017   GLUCOSE 108 (H) 06/24/2017   CHOL 138 06/24/2017   TRIG 53.0 06/24/2017   HDL 51.50 06/24/2017   LDLCALC 75 06/24/2017   ALT 32 06/24/2017   AST 24 06/24/2017   NA 140 06/24/2017   K 3.9 06/24/2017   CL 105 06/24/2017   CREATININE 1.18 06/24/2017   BUN 13 06/24/2017   CO2 29 06/24/2017   TSH 2.52 06/24/2017   MICROALBUR <0.7 05/09/2015    Dg Chest 2 View  Result Date: 05/28/2010 *RADIOLOGY REPORT* Clinical Data: Cough, bilateral lower lobe rales, question pneumonia. CHEST - 2 VIEW Comparison: [facility] at [address] chest left rib radiographs 11/11/2004. Findings: Low lung volumes seen with lungs clear.  Heart size upper limits of normal left ventricular predominant configuration for degree of inspiration.  Mediastinum, hila, pleura, slight dextroscoliosis mid dorsal spine and levoscoliosis lumbar spine visualized with osseous structures otherwise unremarkable. IMPRESSION: 1.  Stable slight scoliosis. 2.  No active cardiopulmonary disease. Original Report Authenticated By: Sydnee Levans, M.D.    Assessment & Plan:  Plan  I am having Nara L. Messamore maintain her spironolactone and diltiazem.  Meds ordered this encounter  Medications  . spironolactone (ALDACTONE) 25 MG tablet    Sig: Take 1 tablet (25 mg total) by mouth daily.    Dispense:  90 tablet    Refill:  1  . diltiazem (CARTIA XT) 240 MG 24 hr capsule    Sig: Take 1 capsule (240 mg total) by mouth daily.    Dispense:  90 capsule    Refill:  1    Problem List Items Addressed This Visit      Unprioritized   HTN (hypertension) - Primary    Well controlled, no changes to meds. Encouraged heart healthy diet such as the DASH diet and exercise as tolerated.       Relevant Medications   spironolactone (ALDACTONE) 25 MG tablet   diltiazem (CARTIA XT) 240 MG 24 hr capsule   Other Relevant  Orders   Comprehensive metabolic panel   Lipid panel      Follow-up: Return in about 6 months (around 06/30/2018), or if symptoms worsen or fail to improve, for annual exam, fasting.  Ann Held, DO

## 2017-12-30 NOTE — Patient Instructions (Signed)
DASH Eating Plan DASH stands for "Dietary Approaches to Stop Hypertension." The DASH eating plan is a healthy eating plan that has been shown to reduce high blood pressure (hypertension). It may also reduce your risk for type 2 diabetes, heart disease, and stroke. The DASH eating plan may also help with weight loss. What are tips for following this plan? General guidelines  Avoid eating more than 2,300 mg (milligrams) of salt (sodium) a day. If you have hypertension, you may need to reduce your sodium intake to 1,500 mg a day.  Limit alcohol intake to no more than 1 drink a day for nonpregnant women and 2 drinks a day for men. One drink equals 12 oz of beer, 5 oz of wine, or 1 oz of hard liquor.  Work with your health care provider to maintain a healthy body weight or to lose weight. Ask what an ideal weight is for you.  Get at least 30 minutes of exercise that causes your heart to beat faster (aerobic exercise) most days of the week. Activities may include walking, swimming, or biking.  Work with your health care provider or diet and nutrition specialist (dietitian) to adjust your eating plan to your individual calorie needs. Reading food labels  Check food labels for the amount of sodium per serving. Choose foods with less than 5 percent of the Daily Value of sodium. Generally, foods with less than 300 mg of sodium per serving fit into this eating plan.  To find whole grains, look for the word "whole" as the first word in the ingredient list. Shopping  Buy products labeled as "low-sodium" or "no salt added."  Buy fresh foods. Avoid canned foods and premade or frozen meals. Cooking  Avoid adding salt when cooking. Use salt-free seasonings or herbs instead of table salt or sea salt. Check with your health care provider or pharmacist before using salt substitutes.  Do not fry foods. Cook foods using healthy methods such as baking, boiling, grilling, and broiling instead.  Cook with  heart-healthy oils, such as olive, canola, soybean, or sunflower oil. Meal planning   Eat a balanced diet that includes: ? 5 or more servings of fruits and vegetables each day. At each meal, try to fill half of your plate with fruits and vegetables. ? Up to 6-8 servings of whole grains each day. ? Less than 6 oz of lean meat, poultry, or fish each day. A 3-oz serving of meat is about the same size as a deck of cards. One egg equals 1 oz. ? 2 servings of low-fat dairy each day. ? A serving of nuts, seeds, or beans 5 times each week. ? Heart-healthy fats. Healthy fats called Omega-3 fatty acids are found in foods such as flaxseeds and coldwater fish, like sardines, salmon, and mackerel.  Limit how much you eat of the following: ? Canned or prepackaged foods. ? Food that is high in trans fat, such as fried foods. ? Food that is high in saturated fat, such as fatty meat. ? Sweets, desserts, sugary drinks, and other foods with added sugar. ? Full-fat dairy products.  Do not salt foods before eating.  Try to eat at least 2 vegetarian meals each week.  Eat more home-cooked food and less restaurant, buffet, and fast food.  When eating at a restaurant, ask that your food be prepared with less salt or no salt, if possible. What foods are recommended? The items listed may not be a complete list. Talk with your dietitian about what   dietary choices are best for you. Grains Whole-grain or whole-wheat bread. Whole-grain or whole-wheat pasta. Brown rice. Oatmeal. Quinoa. Bulgur. Whole-grain and low-sodium cereals. Pita bread. Low-fat, low-sodium crackers. Whole-wheat flour tortillas. Vegetables Fresh or frozen vegetables (raw, steamed, roasted, or grilled). Low-sodium or reduced-sodium tomato and vegetable juice. Low-sodium or reduced-sodium tomato sauce and tomato paste. Low-sodium or reduced-sodium canned vegetables. Fruits All fresh, dried, or frozen fruit. Canned fruit in natural juice (without  added sugar). Meat and other protein foods Skinless chicken or turkey. Ground chicken or turkey. Pork with fat trimmed off. Fish and seafood. Egg whites. Dried beans, peas, or lentils. Unsalted nuts, nut butters, and seeds. Unsalted canned beans. Lean cuts of beef with fat trimmed off. Low-sodium, lean deli meat. Dairy Low-fat (1%) or fat-free (skim) milk. Fat-free, low-fat, or reduced-fat cheeses. Nonfat, low-sodium ricotta or cottage cheese. Low-fat or nonfat yogurt. Low-fat, low-sodium cheese. Fats and oils Soft margarine without trans fats. Vegetable oil. Low-fat, reduced-fat, or light mayonnaise and salad dressings (reduced-sodium). Canola, safflower, olive, soybean, and sunflower oils. Avocado. Seasoning and other foods Herbs. Spices. Seasoning mixes without salt. Unsalted popcorn and pretzels. Fat-free sweets. What foods are not recommended? The items listed may not be a complete list. Talk with your dietitian about what dietary choices are best for you. Grains Baked goods made with fat, such as croissants, muffins, or some breads. Dry pasta or rice meal packs. Vegetables Creamed or fried vegetables. Vegetables in a cheese sauce. Regular canned vegetables (not low-sodium or reduced-sodium). Regular canned tomato sauce and paste (not low-sodium or reduced-sodium). Regular tomato and vegetable juice (not low-sodium or reduced-sodium). Pickles. Olives. Fruits Canned fruit in a light or heavy syrup. Fried fruit. Fruit in cream or butter sauce. Meat and other protein foods Fatty cuts of meat. Ribs. Fried meat. Bacon. Sausage. Bologna and other processed lunch meats. Salami. Fatback. Hotdogs. Bratwurst. Salted nuts and seeds. Canned beans with added salt. Canned or smoked fish. Whole eggs or egg yolks. Chicken or turkey with skin. Dairy Whole or 2% milk, cream, and half-and-half. Whole or full-fat cream cheese. Whole-fat or sweetened yogurt. Full-fat cheese. Nondairy creamers. Whipped toppings.  Processed cheese and cheese spreads. Fats and oils Butter. Stick margarine. Lard. Shortening. Ghee. Bacon fat. Tropical oils, such as coconut, palm kernel, or palm oil. Seasoning and other foods Salted popcorn and pretzels. Onion salt, garlic salt, seasoned salt, table salt, and sea salt. Worcestershire sauce. Tartar sauce. Barbecue sauce. Teriyaki sauce. Soy sauce, including reduced-sodium. Steak sauce. Canned and packaged gravies. Fish sauce. Oyster sauce. Cocktail sauce. Horseradish that you find on the shelf. Ketchup. Mustard. Meat flavorings and tenderizers. Bouillon cubes. Hot sauce and Tabasco sauce. Premade or packaged marinades. Premade or packaged taco seasonings. Relishes. Regular salad dressings. Where to find more information:  National Heart, Lung, and Blood Institute: www.nhlbi.nih.gov  American Heart Association: www.heart.org Summary  The DASH eating plan is a healthy eating plan that has been shown to reduce high blood pressure (hypertension). It may also reduce your risk for type 2 diabetes, heart disease, and stroke.  With the DASH eating plan, you should limit salt (sodium) intake to 2,300 mg a day. If you have hypertension, you may need to reduce your sodium intake to 1,500 mg a day.  When on the DASH eating plan, aim to eat more fresh fruits and vegetables, whole grains, lean proteins, low-fat dairy, and heart-healthy fats.  Work with your health care provider or diet and nutrition specialist (dietitian) to adjust your eating plan to your individual   calorie needs. This information is not intended to replace advice given to you by your health care provider. Make sure you discuss any questions you have with your health care provider. Document Released: 03/18/2011 Document Revised: 03/22/2016 Document Reviewed: 03/22/2016 Elsevier Interactive Patient Education  2018 Elsevier Inc.  

## 2017-12-30 NOTE — Addendum Note (Signed)
Addended by: Kem Boroughs D on: 12/30/2017 10:33 AM   Modules accepted: Orders

## 2018-01-04 NOTE — Addendum Note (Signed)
Addended byDamita Dunnings D on: 01/04/2018 10:56 AM   Modules accepted: Orders

## 2018-01-17 ENCOUNTER — Other Ambulatory Visit: Payer: Self-pay | Admitting: Family Medicine

## 2018-01-17 DIAGNOSIS — I10 Essential (primary) hypertension: Secondary | ICD-10-CM

## 2018-03-03 ENCOUNTER — Ambulatory Visit (INDEPENDENT_AMBULATORY_CARE_PROVIDER_SITE_OTHER): Payer: Managed Care, Other (non HMO)

## 2018-03-03 DIAGNOSIS — Z23 Encounter for immunization: Secondary | ICD-10-CM

## 2018-09-12 ENCOUNTER — Other Ambulatory Visit: Payer: Self-pay | Admitting: Family Medicine

## 2018-09-12 DIAGNOSIS — I10 Essential (primary) hypertension: Secondary | ICD-10-CM

## 2018-09-18 ENCOUNTER — Other Ambulatory Visit: Payer: Self-pay

## 2018-09-18 ENCOUNTER — Ambulatory Visit (INDEPENDENT_AMBULATORY_CARE_PROVIDER_SITE_OTHER): Payer: Managed Care, Other (non HMO) | Admitting: Family Medicine

## 2018-09-18 ENCOUNTER — Encounter: Payer: Self-pay | Admitting: Family Medicine

## 2018-09-18 DIAGNOSIS — I10 Essential (primary) hypertension: Secondary | ICD-10-CM

## 2018-09-18 MED ORDER — DILTIAZEM HCL ER COATED BEADS 300 MG PO CP24: 300.0000 mg | ORAL_CAPSULE | Freq: Every day | ORAL | 2 refills | Status: DC

## 2018-09-18 NOTE — Progress Notes (Signed)
Virtual Visit via Video Note  I connected with Tamara Morton on 09/18/18 at  9:00 AM EDT by a video enabled telemedicine application and verified that I am speaking with the correct person using two identifiers.  Location: Patient: home Provider: home   I discussed the limitations of evaluation and management by telemedicine and the availability of in person appointments. The patient expressed understanding and agreed to proceed.  History of Present Illness: Pt is home with no complaints --- bp f/u No cp, sob, palpitations No fevers congestion or any other covid symptoms    Past Medical History:  Diagnosis Date  . Chicken pox   . Family history of breast cancer   . Family history of colon cancer   . Family history of ovarian cancer   . Family history of prostate cancer   . Family history of stomach cancer   . History of stomach ulcers   . Hypertension    Current Outpatient Medications on File Prior to Visit  Medication Sig Dispense Refill  . spironolactone (ALDACTONE) 25 MG tablet TAKE 1 TABLET DAILY 90 tablet 0   No current facility-administered medications on file prior to visit.     Observations/Objective: 142/93  136/86  142/90 76  Afebrile Pt in NAd rr normal   Assessment and Plan: 1. Essential hypertension Pt will f/u in office in 2-3 weeks or sooner prn  Inc cartia to 300 mg  Poorly controlled will alter medications, encouraged DASH diet, minimize caffeine and obtain adequate sleep. Report concerning symptoms and follow up as directed and as needed - diltiazem (CARTIA XT) 300 MG 24 hr capsule; Take 1 capsule (300 mg total) by mouth daily.  Dispense: 30 capsule; Refill: 2   Follow Up Instructions:    I discussed the assessment and treatment plan with the patient. The patient was provided an opportunity to ask questions and all were answered. The patient agreed with the plan and demonstrated an understanding of the instructions.   The patient was advised to  call back or seek an in-person evaluation if the symptoms worsen or if the condition fails to improve as anticipated.  I provided 15 minutes of non-face-to-face time during this encounter.   Ann Held, DO

## 2018-09-18 NOTE — Patient Instructions (Signed)
DASH Eating Plan  DASH stands for "Dietary Approaches to Stop Hypertension." The DASH eating plan is a healthy eating plan that has been shown to reduce high blood pressure (hypertension). It may also reduce your risk for type 2 diabetes, heart disease, and stroke. The DASH eating plan may also help with weight loss.  What are tips for following this plan?    General guidelines   Avoid eating more than 2,300 mg (milligrams) of salt (sodium) a day. If you have hypertension, you may need to reduce your sodium intake to 1,500 mg a day.   Limit alcohol intake to no more than 1 drink a day for nonpregnant women and 2 drinks a day for men. One drink equals 12 oz of beer, 5 oz of wine, or 1 oz of hard liquor.   Work with your health care provider to maintain a healthy body weight or to lose weight. Ask what an ideal weight is for you.   Get at least 30 minutes of exercise that causes your heart to beat faster (aerobic exercise) most days of the week. Activities may include walking, swimming, or biking.   Work with your health care provider or diet and nutrition specialist (dietitian) to adjust your eating plan to your individual calorie needs.  Reading food labels     Check food labels for the amount of sodium per serving. Choose foods with less than 5 percent of the Daily Value of sodium. Generally, foods with less than 300 mg of sodium per serving fit into this eating plan.   To find whole grains, look for the word "whole" as the first word in the ingredient list.  Shopping   Buy products labeled as "low-sodium" or "no salt added."   Buy fresh foods. Avoid canned foods and premade or frozen meals.  Cooking   Avoid adding salt when cooking. Use salt-free seasonings or herbs instead of table salt or sea salt. Check with your health care provider or pharmacist before using salt substitutes.   Do not fry foods. Cook foods using healthy methods such as baking, boiling, grilling, and broiling instead.   Cook with  heart-healthy oils, such as olive, canola, soybean, or sunflower oil.  Meal planning   Eat a balanced diet that includes:  ? 5 or more servings of fruits and vegetables each day. At each meal, try to fill half of your plate with fruits and vegetables.  ? Up to 6-8 servings of whole grains each day.  ? Less than 6 oz of lean meat, poultry, or fish each day. A 3-oz serving of meat is about the same size as a deck of cards. One egg equals 1 oz.  ? 2 servings of low-fat dairy each day.  ? A serving of nuts, seeds, or beans 5 times each week.  ? Heart-healthy fats. Healthy fats called Omega-3 fatty acids are found in foods such as flaxseeds and coldwater fish, like sardines, salmon, and mackerel.   Limit how much you eat of the following:  ? Canned or prepackaged foods.  ? Food that is high in trans fat, such as fried foods.  ? Food that is high in saturated fat, such as fatty meat.  ? Sweets, desserts, sugary drinks, and other foods with added sugar.  ? Full-fat dairy products.   Do not salt foods before eating.   Try to eat at least 2 vegetarian meals each week.   Eat more home-cooked food and less restaurant, buffet, and fast food.     When eating at a restaurant, ask that your food be prepared with less salt or no salt, if possible.  What foods are recommended?  The items listed may not be a complete list. Talk with your dietitian about what dietary choices are best for you.  Grains  Whole-grain or whole-wheat bread. Whole-grain or whole-wheat pasta. Brown rice. Oatmeal. Quinoa. Bulgur. Whole-grain and low-sodium cereals. Pita bread. Low-fat, low-sodium crackers. Whole-wheat flour tortillas.  Vegetables  Fresh or frozen vegetables (raw, steamed, roasted, or grilled). Low-sodium or reduced-sodium tomato and vegetable juice. Low-sodium or reduced-sodium tomato sauce and tomato paste. Low-sodium or reduced-sodium canned vegetables.  Fruits  All fresh, dried, or frozen fruit. Canned fruit in natural juice (without  added sugar).  Meat and other protein foods  Skinless chicken or turkey. Ground chicken or turkey. Pork with fat trimmed off. Fish and seafood. Egg whites. Dried beans, peas, or lentils. Unsalted nuts, nut butters, and seeds. Unsalted canned beans. Lean cuts of beef with fat trimmed off. Low-sodium, lean deli meat.  Dairy  Low-fat (1%) or fat-free (skim) milk. Fat-free, low-fat, or reduced-fat cheeses. Nonfat, low-sodium ricotta or cottage cheese. Low-fat or nonfat yogurt. Low-fat, low-sodium cheese.  Fats and oils  Soft margarine without trans fats. Vegetable oil. Low-fat, reduced-fat, or light mayonnaise and salad dressings (reduced-sodium). Canola, safflower, olive, soybean, and sunflower oils. Avocado.  Seasoning and other foods  Herbs. Spices. Seasoning mixes without salt. Unsalted popcorn and pretzels. Fat-free sweets.  What foods are not recommended?  The items listed may not be a complete list. Talk with your dietitian about what dietary choices are best for you.  Grains  Baked goods made with fat, such as croissants, muffins, or some breads. Dry pasta or rice meal packs.  Vegetables  Creamed or fried vegetables. Vegetables in a cheese sauce. Regular canned vegetables (not low-sodium or reduced-sodium). Regular canned tomato sauce and paste (not low-sodium or reduced-sodium). Regular tomato and vegetable juice (not low-sodium or reduced-sodium). Pickles. Olives.  Fruits  Canned fruit in a light or heavy syrup. Fried fruit. Fruit in cream or butter sauce.  Meat and other protein foods  Fatty cuts of meat. Ribs. Fried meat. Bacon. Sausage. Bologna and other processed lunch meats. Salami. Fatback. Hotdogs. Bratwurst. Salted nuts and seeds. Canned beans with added salt. Canned or smoked fish. Whole eggs or egg yolks. Chicken or turkey with skin.  Dairy  Whole or 2% milk, cream, and half-and-half. Whole or full-fat cream cheese. Whole-fat or sweetened yogurt. Full-fat cheese. Nondairy creamers. Whipped toppings.  Processed cheese and cheese spreads.  Fats and oils  Butter. Stick margarine. Lard. Shortening. Ghee. Bacon fat. Tropical oils, such as coconut, palm kernel, or palm oil.  Seasoning and other foods  Salted popcorn and pretzels. Onion salt, garlic salt, seasoned salt, table salt, and sea salt. Worcestershire sauce. Tartar sauce. Barbecue sauce. Teriyaki sauce. Soy sauce, including reduced-sodium. Steak sauce. Canned and packaged gravies. Fish sauce. Oyster sauce. Cocktail sauce. Horseradish that you find on the shelf. Ketchup. Mustard. Meat flavorings and tenderizers. Bouillon cubes. Hot sauce and Tabasco sauce. Premade or packaged marinades. Premade or packaged taco seasonings. Relishes. Regular salad dressings.  Where to find more information:   National Heart, Lung, and Blood Institute: www.nhlbi.nih.gov   American Heart Association: www.heart.org  Summary   The DASH eating plan is a healthy eating plan that has been shown to reduce high blood pressure (hypertension). It may also reduce your risk for type 2 diabetes, heart disease, and stroke.   With the   DASH eating plan, you should limit salt (sodium) intake to 2,300 mg a day. If you have hypertension, you may need to reduce your sodium intake to 1,500 mg a day.   When on the DASH eating plan, aim to eat more fresh fruits and vegetables, whole grains, lean proteins, low-fat dairy, and heart-healthy fats.   Work with your health care provider or diet and nutrition specialist (dietitian) to adjust your eating plan to your individual calorie needs.  This information is not intended to replace advice given to you by your health care provider. Make sure you discuss any questions you have with your health care provider.  Document Released: 03/18/2011 Document Revised: 03/22/2016 Document Reviewed: 03/22/2016  Elsevier Interactive Patient Education  2019 Elsevier Inc.

## 2018-10-03 ENCOUNTER — Other Ambulatory Visit: Payer: Self-pay

## 2018-10-03 ENCOUNTER — Ambulatory Visit (INDEPENDENT_AMBULATORY_CARE_PROVIDER_SITE_OTHER): Payer: Managed Care, Other (non HMO) | Admitting: Family Medicine

## 2018-10-03 ENCOUNTER — Encounter: Payer: Self-pay | Admitting: Family Medicine

## 2018-10-03 DIAGNOSIS — I10 Essential (primary) hypertension: Secondary | ICD-10-CM | POA: Diagnosis not present

## 2018-10-03 LAB — COMPREHENSIVE METABOLIC PANEL
ALT: 29 U/L (ref 0–35)
AST: 21 U/L (ref 0–37)
Albumin: 4.1 g/dL (ref 3.5–5.2)
Alkaline Phosphatase: 85 U/L (ref 39–117)
BUN: 15 mg/dL (ref 6–23)
CO2: 25 mEq/L (ref 19–32)
Calcium: 9.1 mg/dL (ref 8.4–10.5)
Chloride: 106 mEq/L (ref 96–112)
Creatinine, Ser: 1.1 mg/dL (ref 0.40–1.20)
GFR: 62.72 mL/min (ref >60.00)
Glucose, Bld: 95 mg/dL (ref 70–99)
Potassium: 3.9 mEq/L (ref 3.5–5.1)
Sodium: 139 mEq/L (ref 135–145)
Total Bilirubin: 0.4 mg/dL (ref 0.2–1.2)
Total Protein: 7.6 g/dL (ref 6.0–8.3)

## 2018-10-03 LAB — LIPID PANEL
Cholesterol: 143 mg/dL (ref 0–200)
HDL: 51 mg/dL (ref >39.00)
LDL Cholesterol: 80 mg/dL (ref 0–99)
NonHDL: 92.35
Total CHOL/HDL Ratio: 3
Triglycerides: 61 mg/dL (ref 0.0–149.0)
VLDL: 12.2 mg/dL (ref 0.0–40.0)

## 2018-10-03 MED ORDER — DILTIAZEM HCL ER COATED BEADS 300 MG PO CP24: 300.0000 mg | ORAL_CAPSULE | Freq: Every day | ORAL | 1 refills | Status: DC

## 2018-10-03 MED ORDER — SPIRONOLACTONE 25 MG PO TABS: 25.0000 mg | ORAL_TABLET | Freq: Every day | ORAL | 1 refills | Status: DC

## 2018-10-03 NOTE — Assessment & Plan Note (Signed)
Well controlled, no changes to meds. Encouraged heart healthy diet such as the DASH diet and exercise as tolerated.  °

## 2018-10-03 NOTE — Patient Instructions (Signed)
DASH Eating Plan  DASH stands for "Dietary Approaches to Stop Hypertension." The DASH eating plan is a healthy eating plan that has been shown to reduce high blood pressure (hypertension). It may also reduce your risk for type 2 diabetes, heart disease, and stroke. The DASH eating plan may also help with weight loss.  What are tips for following this plan?    General guidelines   Avoid eating more than 2,300 mg (milligrams) of salt (sodium) a day. If you have hypertension, you may need to reduce your sodium intake to 1,500 mg a day.   Limit alcohol intake to no more than 1 drink a day for nonpregnant women and 2 drinks a day for men. One drink equals 12 oz of beer, 5 oz of wine, or 1 oz of hard liquor.   Work with your health care provider to maintain a healthy body weight or to lose weight. Ask what an ideal weight is for you.   Get at least 30 minutes of exercise that causes your heart to beat faster (aerobic exercise) most days of the week. Activities may include walking, swimming, or biking.   Work with your health care provider or diet and nutrition specialist (dietitian) to adjust your eating plan to your individual calorie needs.  Reading food labels     Check food labels for the amount of sodium per serving. Choose foods with less than 5 percent of the Daily Value of sodium. Generally, foods with less than 300 mg of sodium per serving fit into this eating plan.   To find whole grains, look for the word "whole" as the first word in the ingredient list.  Shopping   Buy products labeled as "low-sodium" or "no salt added."   Buy fresh foods. Avoid canned foods and premade or frozen meals.  Cooking   Avoid adding salt when cooking. Use salt-free seasonings or herbs instead of table salt or sea salt. Check with your health care provider or pharmacist before using salt substitutes.   Do not fry foods. Cook foods using healthy methods such as baking, boiling, grilling, and broiling instead.   Cook with  heart-healthy oils, such as olive, canola, soybean, or sunflower oil.  Meal planning   Eat a balanced diet that includes:  ? 5 or more servings of fruits and vegetables each day. At each meal, try to fill half of your plate with fruits and vegetables.  ? Up to 6-8 servings of whole grains each day.  ? Less than 6 oz of lean meat, poultry, or fish each day. A 3-oz serving of meat is about the same size as a deck of cards. One egg equals 1 oz.  ? 2 servings of low-fat dairy each day.  ? A serving of nuts, seeds, or beans 5 times each week.  ? Heart-healthy fats. Healthy fats called Omega-3 fatty acids are found in foods such as flaxseeds and coldwater fish, like sardines, salmon, and mackerel.   Limit how much you eat of the following:  ? Canned or prepackaged foods.  ? Food that is high in trans fat, such as fried foods.  ? Food that is high in saturated fat, such as fatty meat.  ? Sweets, desserts, sugary drinks, and other foods with added sugar.  ? Full-fat dairy products.   Do not salt foods before eating.   Try to eat at least 2 vegetarian meals each week.   Eat more home-cooked food and less restaurant, buffet, and fast food.     When eating at a restaurant, ask that your food be prepared with less salt or no salt, if possible.  What foods are recommended?  The items listed may not be a complete list. Talk with your dietitian about what dietary choices are best for you.  Grains  Whole-grain or whole-wheat bread. Whole-grain or whole-wheat pasta. Brown rice. Oatmeal. Quinoa. Bulgur. Whole-grain and low-sodium cereals. Pita bread. Low-fat, low-sodium crackers. Whole-wheat flour tortillas.  Vegetables  Fresh or frozen vegetables (raw, steamed, roasted, or grilled). Low-sodium or reduced-sodium tomato and vegetable juice. Low-sodium or reduced-sodium tomato sauce and tomato paste. Low-sodium or reduced-sodium canned vegetables.  Fruits  All fresh, dried, or frozen fruit. Canned fruit in natural juice (without  added sugar).  Meat and other protein foods  Skinless chicken or turkey. Ground chicken or turkey. Pork with fat trimmed off. Fish and seafood. Egg whites. Dried beans, peas, or lentils. Unsalted nuts, nut butters, and seeds. Unsalted canned beans. Lean cuts of beef with fat trimmed off. Low-sodium, lean deli meat.  Dairy  Low-fat (1%) or fat-free (skim) milk. Fat-free, low-fat, or reduced-fat cheeses. Nonfat, low-sodium ricotta or cottage cheese. Low-fat or nonfat yogurt. Low-fat, low-sodium cheese.  Fats and oils  Soft margarine without trans fats. Vegetable oil. Low-fat, reduced-fat, or light mayonnaise and salad dressings (reduced-sodium). Canola, safflower, olive, soybean, and sunflower oils. Avocado.  Seasoning and other foods  Herbs. Spices. Seasoning mixes without salt. Unsalted popcorn and pretzels. Fat-free sweets.  What foods are not recommended?  The items listed may not be a complete list. Talk with your dietitian about what dietary choices are best for you.  Grains  Baked goods made with fat, such as croissants, muffins, or some breads. Dry pasta or rice meal packs.  Vegetables  Creamed or fried vegetables. Vegetables in a cheese sauce. Regular canned vegetables (not low-sodium or reduced-sodium). Regular canned tomato sauce and paste (not low-sodium or reduced-sodium). Regular tomato and vegetable juice (not low-sodium or reduced-sodium). Pickles. Olives.  Fruits  Canned fruit in a light or heavy syrup. Fried fruit. Fruit in cream or butter sauce.  Meat and other protein foods  Fatty cuts of meat. Ribs. Fried meat. Bacon. Sausage. Bologna and other processed lunch meats. Salami. Fatback. Hotdogs. Bratwurst. Salted nuts and seeds. Canned beans with added salt. Canned or smoked fish. Whole eggs or egg yolks. Chicken or turkey with skin.  Dairy  Whole or 2% milk, cream, and half-and-half. Whole or full-fat cream cheese. Whole-fat or sweetened yogurt. Full-fat cheese. Nondairy creamers. Whipped toppings.  Processed cheese and cheese spreads.  Fats and oils  Butter. Stick margarine. Lard. Shortening. Ghee. Bacon fat. Tropical oils, such as coconut, palm kernel, or palm oil.  Seasoning and other foods  Salted popcorn and pretzels. Onion salt, garlic salt, seasoned salt, table salt, and sea salt. Worcestershire sauce. Tartar sauce. Barbecue sauce. Teriyaki sauce. Soy sauce, including reduced-sodium. Steak sauce. Canned and packaged gravies. Fish sauce. Oyster sauce. Cocktail sauce. Horseradish that you find on the shelf. Ketchup. Mustard. Meat flavorings and tenderizers. Bouillon cubes. Hot sauce and Tabasco sauce. Premade or packaged marinades. Premade or packaged taco seasonings. Relishes. Regular salad dressings.  Where to find more information:   National Heart, Lung, and Blood Institute: www.nhlbi.nih.gov   American Heart Association: www.heart.org  Summary   The DASH eating plan is a healthy eating plan that has been shown to reduce high blood pressure (hypertension). It may also reduce your risk for type 2 diabetes, heart disease, and stroke.   With the   DASH eating plan, you should limit salt (sodium) intake to 2,300 mg a day. If you have hypertension, you may need to reduce your sodium intake to 1,500 mg a day.   When on the DASH eating plan, aim to eat more fresh fruits and vegetables, whole grains, lean proteins, low-fat dairy, and heart-healthy fats.   Work with your health care provider or diet and nutrition specialist (dietitian) to adjust your eating plan to your individual calorie needs.  This information is not intended to replace advice given to you by your health care provider. Make sure you discuss any questions you have with your health care provider.  Document Released: 03/18/2011 Document Revised: 03/22/2016 Document Reviewed: 03/22/2016  Elsevier Interactive Patient Education  2019 Elsevier Inc.

## 2018-10-03 NOTE — Progress Notes (Signed)
+ Patient ID: Tamara Morton, female    DOB: [birth year]  Age: 53 y.o. MRN: [id number]    Subjective:  Subjective  HPI Tamara Morton presents for f/u bp and labs No complaints.    Review of Systems  Constitutional: Negative for appetite change, diaphoresis, fatigue and unexpected weight change.  Eyes: Negative for pain, redness and visual disturbance.  Respiratory: Negative for cough, chest tightness, shortness of breath and wheezing.   Cardiovascular: Negative for chest pain, palpitations and leg swelling.  Endocrine: Negative for cold intolerance, heat intolerance, polydipsia, polyphagia and polyuria.  Genitourinary: Negative for difficulty urinating, dysuria and frequency.  Neurological: Negative for dizziness, light-headedness, numbness and headaches.    History Past Medical History:  Diagnosis Date  . Chicken pox   . Family history of breast cancer   . Family history of colon cancer   . Family history of ovarian cancer   . Family history of prostate cancer   . Family history of stomach cancer   . History of stomach ulcers   . Hypertension     She has a past surgical history that includes Foot surgery (Right, "About 6-7 yrs ago") and Abdominal hysterectomy (2009).   Her family history includes Breast cancer (age of onset: 34) in her maternal aunt; Colon cancer (age of onset: 39) in her maternal aunt; Diabetes in her maternal aunt and maternal grandmother; Glaucoma in her mother; Gout in her mother; Heart disease in her father; Hematuria in her father; Hypertension in her father and mother; Lung cancer in her maternal aunt; Ovarian cancer (age of onset: 41) in her maternal aunt; Prostate cancer (age of onset: 29) in her maternal uncle; Stomach cancer (age of onset: 14) in her father.She reports that she has never smoked. She has never used smokeless tobacco. She reports current alcohol use. She reports that she does not use drugs.  No current outpatient medications on file  prior to visit.   No current facility-administered medications on file prior to visit.      Objective:  Objective  Physical Exam Vitals signs and nursing note reviewed.  Constitutional:      Appearance: She is well-developed.  HENT:     Head: Normocephalic and atraumatic.  Eyes:     Conjunctiva/sclera: Conjunctivae normal.  Neck:     Musculoskeletal: Normal range of motion and neck supple.     Thyroid: No thyromegaly.     Vascular: No carotid bruit or JVD.  Cardiovascular:     Rate and Rhythm: Normal rate and regular rhythm.     Heart sounds: Normal heart sounds. No murmur.  Pulmonary:     Effort: Pulmonary effort is normal. No respiratory distress.     Breath sounds: Normal breath sounds. No wheezing or rales.  Chest:     Chest wall: No tenderness.  Neurological:     Mental Status: She is alert and oriented to person, place, and time.    BP 122/80 (BP Location: Left Arm, Patient Position: Sitting, Cuff Size: Large)   Pulse 61   Temp 98.5 F (36.9 C) (Oral)   Resp 16   Ht 5\' 7"  (1.702 m)   Wt 214 lb (97.1 kg)   SpO2 98%   BMI 33.52 kg/m  Wt Readings from Last 3 Encounters:  10/03/18 214 lb (97.1 kg)  12/30/17 212 lb 6.4 oz (96.3 kg)  06/24/17 214 lb 3.2 oz (97.2 kg)     Lab Results  Component Value Date   WBC 4.9 06/24/2017  HGB 14.0 06/24/2017   HCT 41.6 06/24/2017   PLT 281.0 06/24/2017   GLUCOSE 96 12/30/2017   CHOL 141 12/30/2017   TRIG 76.0 12/30/2017   HDL 43.80 12/30/2017   LDLCALC 82 12/30/2017   ALT 30 12/30/2017   AST 22 12/30/2017   NA 142 12/30/2017   K 4.1 12/30/2017   CL 107 12/30/2017   CREATININE 1.23 (H) 12/30/2017   BUN 15 12/30/2017   CO2 27 12/30/2017   TSH 2.52 06/24/2017   MICROALBUR <0.7 05/09/2015    Dg Chest 2 View  Result Date: 05/28/2010 *RADIOLOGY REPORT* Clinical Data: Cough, bilateral lower lobe rales, question pneumonia. CHEST - 2 VIEW Comparison: [facility] at [address] chest left rib radiographs  11/11/2004. Findings: Low lung volumes seen with lungs clear.  Heart size upper limits of normal left ventricular predominant configuration for degree of inspiration.  Mediastinum, hila, pleura, slight dextroscoliosis mid dorsal spine and levoscoliosis lumbar spine visualized with osseous structures otherwise unremarkable. IMPRESSION: 1.  Stable slight scoliosis. 2.  No active cardiopulmonary disease. Original Report Authenticated By: Sydnee Levans, M.D.    Assessment & Plan:  Plan  I have changed Laylee L. Venhuizen's spironolactone. I am also having her maintain her diltiazem.  Meds ordered this encounter  Medications  . diltiazem (CARTIA XT) 300 MG 24 hr capsule    Sig: Take 1 capsule (300 mg total) by mouth daily.    Dispense:  90 capsule    Refill:  1  . spironolactone (ALDACTONE) 25 MG tablet    Sig: Take 1 tablet (25 mg total) by mouth daily.    Dispense:  90 tablet    Refill:  1    Problem List Items Addressed This Visit      Unprioritized   HTN (hypertension)    Well controlled, no changes to meds. Encouraged heart healthy diet such as the DASH diet and exercise as tolerated.        Relevant Medications   diltiazem (CARTIA XT) 300 MG 24 hr capsule   spironolactone (ALDACTONE) 25 MG tablet   Other Relevant Orders   Lipid panel   Comprehensive metabolic panel      Follow-up: Return in about 6 months (around 04/04/2019) for annual exam, fasting.  Ann Held, DO

## 2019-01-12 ENCOUNTER — Ambulatory Visit: Payer: Managed Care, Other (non HMO)

## 2019-03-05 ENCOUNTER — Other Ambulatory Visit: Payer: Self-pay | Admitting: Family Medicine

## 2019-03-05 DIAGNOSIS — I10 Essential (primary) hypertension: Secondary | ICD-10-CM

## 2019-04-16 ENCOUNTER — Encounter: Payer: Self-pay | Admitting: Family Medicine

## 2019-04-20 ENCOUNTER — Encounter: Payer: Self-pay | Admitting: Family Medicine

## 2019-04-20 ENCOUNTER — Other Ambulatory Visit: Payer: Self-pay

## 2019-04-20 ENCOUNTER — Ambulatory Visit (INDEPENDENT_AMBULATORY_CARE_PROVIDER_SITE_OTHER): Payer: Managed Care, Other (non HMO) | Admitting: Family Medicine

## 2019-04-20 VITALS — Temp 97.7°F | Ht 67.0 in | Wt 210.0 lb

## 2019-04-20 DIAGNOSIS — I10 Essential (primary) hypertension: Secondary | ICD-10-CM | POA: Diagnosis not present

## 2019-04-20 DIAGNOSIS — U071 COVID-19: Secondary | ICD-10-CM | POA: Insufficient documentation

## 2019-04-20 NOTE — Progress Notes (Deleted)
Virtual Visit via Video Note  I connected with [place] on 04/20/19 at  9:40 AM EST by a video enabled telemedicine application and verified that I am speaking with the correct person using two identifiers.  Location: Patient: *** Provider: ***   I discussed the limitations of evaluation and management by telemedicine and the availability of in person appointments. The patient expressed understanding and agreed to proceed.  History of Present Illness: Pt tested + for covid on Jan 2 ---- She is taking mucinex DM for cough     Observations/Objective: Vitals:   04/20/19 0937  Temp: 97.7 F (36.5 C)   Pt is home --  Assessment and Plan:   Follow Up Instructions:    I discussed the assessment and treatment plan with the patient. The patient was provided an opportunity to ask questions and all were answered. The patient agreed with the plan and demonstrated an understanding of the instructions.   The patient was advised to call back or seek an in-person evaluation if the symptoms worsen or if the condition fails to improve as anticipated.  I provided *** minutes of non-face-to-face time during this encounter.   Ann Held, DO

## 2019-04-20 NOTE — Assessment & Plan Note (Signed)
con't mucinex dm con't reg meds Extend quarantine until next Wed

## 2019-04-20 NOTE — Progress Notes (Addendum)
Virtual Visit via Telephone Note  I connected with [place] on 04/20/19 at  9:40 AM EST by telephone and verified that I am speaking with the correct person using two identifiers.  Location: Patient: home alone Provider: home    I discussed the limitations, risks, security and privacy concerns of performing an evaluation and management service by telephone and the availability of in person appointments. I also discussed with the patient that there may be a patient responsible charge related to this service. The patient expressed understanding and agreed to proceed.   History of Present Illness: Pt is home-- she was dx with covid 1/2 and her quarantine ended 1/7 but she is still having symptoms --  She does feel like she is improving but still feels exhausted , has a cough, chills  Past Medical History:  Diagnosis Date  . Chicken pox   . Family history of breast cancer   . Family history of colon cancer   . Family history of ovarian cancer   . Family history of prostate cancer   . Family history of stomach cancer   . History of stomach ulcers   . Hypertension    Current Outpatient Medications on File Prior to Visit  Medication Sig Dispense Refill  . diltiazem (CARDIZEM CD) 300 MG 24 hr capsule TAKE 1 CAPSULE DAILY 90 capsule 1  . spironolactone (ALDACTONE) 25 MG tablet Take 1 tablet (25 mg total) by mouth daily. 90 tablet 1   No current facility-administered medications on file prior to visit.   No Known Allergies   Observations/Objective: Vitals:   04/20/19 0937  Temp: 97.7 F (36.5 C)   Pt is in NAD No SOB  Assessment and Plan: 1. COVID-19 virus infection con't mucinex dm con't reg meds Extend quarantine until next Wed   2. Essential hypertension Well controlled, no changes to meds. Encouraged heart healthy diet such as the DASH diet and exercise as tolerated.    Follow Up Instructions:    I discussed the assessment and treatment plan with the patient.  The patient was provided an opportunity to ask questions and all were answered. The patient agreed with the plan and demonstrated an understanding of the instructions.   The patient was advised to call back or seek an in-person evaluation if the symptoms worsen or if the condition fails to improve as anticipated.  Virtual visit lasted 20 min   Ann Held, DO

## 2019-05-29 ENCOUNTER — Other Ambulatory Visit: Payer: Self-pay | Admitting: Family Medicine

## 2019-05-29 DIAGNOSIS — I10 Essential (primary) hypertension: Secondary | ICD-10-CM

## 2019-06-17 ENCOUNTER — Ambulatory Visit: Payer: Managed Care, Other (non HMO) | Attending: Internal Medicine

## 2019-06-17 DIAGNOSIS — Z23 Encounter for immunization: Secondary | ICD-10-CM | POA: Insufficient documentation

## 2019-06-17 NOTE — Progress Notes (Signed)
   Covid-19 Vaccination Clinic  Name:  Tamara Morton    MRN: [id number] DOB: November 11, 1965  06/17/2019  [name] was observed post Covid-19 immunization for 15 minutes without incident. She was provided with Vaccine Information Sheet and instruction to access the V-Safe system.   [name] was instructed to call 911 with any severe reactions post vaccine: Marland Kitchen Difficulty breathing  . Swelling of face and throat  . A fast heartbeat  . A bad rash all over body  . Dizziness and weakness   Immunizations Administered    Name Date Dose VIS Date Route   Pfizer COVID-19 Vaccine 06/17/2019  6:21 PM 0.3 mL 03/23/2019 Intramuscular   Manufacturer: [place]   Lot: [id number]   [place]: [id number]

## 2019-06-27 ENCOUNTER — Ambulatory Visit (INDEPENDENT_AMBULATORY_CARE_PROVIDER_SITE_OTHER): Payer: Managed Care, Other (non HMO)

## 2019-06-27 ENCOUNTER — Encounter (HOSPITAL_COMMUNITY): Payer: Self-pay | Admitting: Emergency Medicine

## 2019-06-27 ENCOUNTER — Ambulatory Visit (HOSPITAL_COMMUNITY)
Admission: EM | Admit: 2019-06-27 | Discharge: 2019-06-27 | Disposition: A | Discharge status: Home / Self Care | Payer: Managed Care, Other (non HMO) | Attending: Family Medicine | Admitting: Family Medicine

## 2019-06-27 ENCOUNTER — Other Ambulatory Visit: Payer: Self-pay

## 2019-06-27 DIAGNOSIS — R109 Unspecified abdominal pain: Secondary | ICD-10-CM | POA: Diagnosis not present

## 2019-06-27 DIAGNOSIS — K5900 Constipation, unspecified: Secondary | ICD-10-CM

## 2019-06-27 LAB — POCT URINALYSIS DIP (DEVICE)
Bilirubin Urine: NEGATIVE
Glucose, UA: NEGATIVE mg/dL
Ketones, ur: NEGATIVE mg/dL
Leukocytes,Ua: NEGATIVE
Nitrite: NEGATIVE
Protein, ur: NEGATIVE mg/dL
Specific Gravity, Urine: 1.025 (ref 1.005–1.030)
Urobilinogen, UA: 0.2 mg/dL (ref 0.0–1.0)
pH: 6 (ref 5.0–8.0)

## 2019-06-27 MED ORDER — POLYETHYLENE GLYCOL 3350 17 G PO PACK: 17.0000 g | PACK | Freq: Every day | ORAL | 0 refills | Status: DC

## 2019-06-27 NOTE — Discharge Instructions (Signed)
Your x ray did not show anything concerning.  Urine without infection Try some miralax to see of this helps. It may take a few days to have a good bowel movement.  If the pain worsens or doesn't improve or you start having nausea, vomiting please go to the ER.

## 2019-06-27 NOTE — ED Provider Notes (Signed)
[facility]    CSN: [id number] Arrival date & time: 06/27/19  1103      History   Chief Complaint Chief Complaint  Patient presents with  . Abdominal Pain  . Back Pain    HPI Tamara Morton is a 54 y.o. female.   Patient is a 54 year old female that presents today with left lower back pain that wraps around to left lower quadrant area.  Symptoms have been constant, waxing and waning.  Reporting last bowel movement was yesterday.  No specific concerns for constipation.  Denies any associated nausea, vomiting, diarrhea, dysuria, hematuria or urinary frequency.  No fever. No injury or heavy lifting. No alcohol use.   ROS per HPI      Past Medical History:  Diagnosis Date  . Chicken pox   . Family history of breast cancer   . Family history of colon cancer   . Family history of ovarian cancer   . Family history of prostate cancer   . Family history of stomach cancer   . History of stomach ulcers   . Hypertension     Patient Active Problem List   Diagnosis Date Noted  . COVID-19 virus infection 04/20/2019  . Genetic testing 08/26/2017  . Family history of colon cancer   . Family history of stomach cancer   . Family history of prostate cancer   . Family history of ovarian cancer   . Family history of breast cancer   . Preventative health care 06/20/2016  . Lateral epicondylitis of right elbow 11/26/2015  . Knee LCL sprain 11/26/2015  . HTN (hypertension) 11/11/2015    Past Surgical History:  Procedure Laterality Date  . ABDOMINAL HYSTERECTOMY  2009   uterine fibroids  TAH BSO  . FOOT SURGERY Right "About 6-7 yrs ago"    OB History   No obstetric history on file.      Home Medications    Prior to Admission medications   Medication Sig Start Date End Date Taking? Authorizing Provider  diltiazem (CARDIZEM CD) 300 MG 24 hr capsule TAKE 1 CAPSULE DAILY 03/06/19  Yes Carollee Herter, Kendrick Fries R, DO  spironolactone (ALDACTONE) 25 MG tablet TAKE 1  TABLET DAILY 05/29/19  Yes Roma Schanz R, DO  polyethylene glycol (MIRALAX / GLYCOLAX) 17 g packet Take 17 g by mouth daily. 06/27/19   Orvan July, NP    Family History Family History  Problem Relation Age of Onset  . Hypertension Mother   . Gout Mother   . Glaucoma Mother   . Hypertension Father   . Heart disease Father   . Hematuria Father   . Stomach cancer Father 65  . Prostate cancer Maternal Uncle 61       metastatic  . Colon cancer Maternal Aunt 8  . Diabetes Maternal Grandmother   . Diabetes Maternal Aunt   . Ovarian cancer Maternal Aunt 62  . Breast cancer Maternal Aunt 68  . Lung cancer Maternal Aunt   . Esophageal cancer Neg Hx   . Rectal cancer Neg Hx     Social History Social History   Tobacco Use  . Smoking status: Never Smoker  . Smokeless tobacco: Never Used  Substance Use Topics  . Alcohol use: Yes    Alcohol/week: 0.0 standard drinks    Comment: rare  . Drug use: No     Allergies   Patient has no known allergies.   Review of Systems Review of Systems   Physical Exam  Triage Vital Signs ED Triage Vitals  Enc Vitals Group     BP 06/27/19 1117 128/80     Pulse Rate 06/27/19 1117 72     Resp 06/27/19 1117 16     Temp 06/27/19 1117 98.3 F (36.8 C)     Temp Source 06/27/19 1117 Oral     SpO2 06/27/19 1117 98 %     Weight --      Height --      Head Circumference --      Peak Flow --      Pain Score 06/27/19 1116 8     Pain Loc --      Pain Edu? --      Excl. in [place]? --    No data found.  Updated Vital Signs BP 128/80   Pulse 72   Temp 98.3 F (36.8 C) (Oral)   Resp 16   SpO2 98%   Visual Acuity Right Eye Distance:   Left Eye Distance:   Bilateral Distance:    Right Eye Near:   Left Eye Near:    Bilateral Near:     Physical Exam Vitals and nursing note reviewed.  Constitutional:      General: She is not in acute distress.    Appearance: She is well-developed. She is not ill-appearing, toxic-appearing or  diaphoretic.  HENT:     Head: Normocephalic and atraumatic.  Pulmonary:     Effort: Pulmonary effort is normal.  Abdominal:     General: Bowel sounds are normal.     Palpations: Abdomen is soft. There is no hepatomegaly or splenomegaly.     Tenderness: There is abdominal tenderness in the left upper quadrant and left lower quadrant. There is left CVA tenderness. There is no guarding or rebound.     Hernia: No hernia is present.  Skin:    General: Skin is warm and dry.  Neurological:     Mental Status: She is alert.  Psychiatric:        Mood and Affect: Mood normal.      UC Treatments / Results  Labs (all labs ordered are listed, but only abnormal results are displayed) Labs Reviewed  POCT URINALYSIS DIP (DEVICE) - Abnormal; Notable for the following components:      Result Value   Hgb urine dipstick TRACE (*)    All other components within normal limits    EKG   Radiology DG Abd 2 Views  Result Date: 06/27/2019 CLINICAL DATA:  Low back pain for 4 days, tracking towards the left lower quadrant. EXAM: ABDOMEN - 2 VIEW COMPARISON:  None. FINDINGS: The bowel gas pattern is nonobstructive. There is fluid within the stomach, and scattered air-fluid levels within nondistended colon. No free intraperitoneal air or suspicious abdominal calcification. Small pelvic calcifications are likely phleboliths. There are mild degenerative changes within the lumbar spine. IMPRESSION: No radiographic evidence of acute abdominal process. Electronically Signed   By: Richardean Sale M.D.   On: 06/27/2019 12:32    Procedures Procedures (including critical care time)  Medications Ordered in UC Medications - No data to display  Initial Impression / Assessment and Plan / UC Course  I have reviewed the triage vital signs and the nursing notes.  Pertinent labs & imaging results that were available during my care of the patient were reviewed by me and considered in my medical decision making (see  chart for details).     Flank pain-urine with trace blood No signs of infection  Mild CVA tenderness.  No acute abdomen on exam. Vital signs normal Is not ill-appearing. X-ray without any acute abdominal process To be suffering from mild constipation.  We will have her try MiraLAX to see if this improves her symptoms.  If her pain worsen she will need to go to the ER.  Final Clinical Impressions(s) / UC Diagnoses   Final diagnoses:  None     Discharge Instructions     Your x ray did not show anything concerning.  Urine without infection Try some miralax to see of this helps. It may take a few days to have a good bowel movement.  If the pain worsens or doesn't improve or you start having nausea, vomiting please go to the ER.     ED Prescriptions    Medication Sig Dispense Auth. Provider   polyethylene glycol (MIRALAX / GLYCOLAX) 17 g packet Take 17 g by mouth daily. 14 each Orvan July, NP     PDMP not reviewed this encounter.   Orvan July, NP 06/27/19 1444

## 2019-06-27 NOTE — ED Triage Notes (Signed)
PT reports left sided lower back pain that started 4 days ago. Pain began to wrap around to LLQ Monday. No nausea, vomiting, diarrhea, dysuria.

## 2019-07-18 ENCOUNTER — Ambulatory Visit: Payer: Managed Care, Other (non HMO) | Attending: Internal Medicine

## 2019-07-18 DIAGNOSIS — Z23 Encounter for immunization: Secondary | ICD-10-CM

## 2019-07-18 NOTE — Progress Notes (Signed)
   Covid-19 Vaccination Clinic  Name:  Tamara Morton    MRN: [id number] DOB: 08-28-1965  07/18/2019  [name] was observed post Covid-19 immunization for 15 minutes without incident. She was provided with Vaccine Information Sheet and instruction to access the V-Safe system.   [name] was instructed to call 911 with any severe reactions post vaccine: Marland Kitchen Difficulty breathing  . Swelling of face and throat  . A fast heartbeat  . A bad rash all over body  . Dizziness and weakness   Immunizations Administered    Name Date Dose VIS Date Route   Pfizer COVID-19 Vaccine 07/18/2019  9:31 AM 0.3 mL 03/23/2019 Intramuscular   Manufacturer: [employer], [employer]   Lot: [id number]   [place]: [id number]

## 2019-07-28 ENCOUNTER — Other Ambulatory Visit: Payer: Self-pay | Admitting: Family Medicine

## 2019-07-28 DIAGNOSIS — I10 Essential (primary) hypertension: Secondary | ICD-10-CM

## 2019-11-24 ENCOUNTER — Other Ambulatory Visit: Payer: Self-pay | Admitting: Family Medicine

## 2019-11-24 DIAGNOSIS — I10 Essential (primary) hypertension: Secondary | ICD-10-CM

## 2019-12-29 ENCOUNTER — Encounter: Payer: Self-pay | Admitting: *Deleted

## 2019-12-29 ENCOUNTER — Other Ambulatory Visit: Payer: Self-pay

## 2019-12-29 ENCOUNTER — Ambulatory Visit
Admission: EM | Admit: 2019-12-29 | Discharge: 2019-12-29 | Disposition: A | Discharge status: Home / Self Care | Payer: Managed Care, Other (non HMO) | Attending: Physician Assistant | Admitting: Physician Assistant

## 2019-12-29 DIAGNOSIS — M79605 Pain in left leg: Secondary | ICD-10-CM

## 2019-12-29 MED ORDER — TIZANIDINE HCL 2 MG PO TABS: 2.0000 mg | ORAL_TABLET | Freq: Three times a day (TID) | ORAL | 0 refills | Status: DC | PRN

## 2019-12-29 MED ORDER — PREDNISONE 50 MG PO TABS: 50.0000 mg | ORAL_TABLET | Freq: Every day | ORAL | 0 refills | Status: DC

## 2019-12-29 NOTE — Discharge Instructions (Signed)
Prednisone as directed. Tizanidine as needed, this can make you drowsy, so do not take if you are going to drive, operate heavy machinery, or make important decisions. Ice/heat compresses as needed.  Follow up with PCP/orthopedics if symptoms worsen, changes for reevaluation. If experience numbness/tingling of the inner thighs, loss of bladder or bowel control, go to the emergency department for evaluation.

## 2019-12-29 NOTE — ED Triage Notes (Signed)
Pt stated---another person fell down the step or landed on pt foot--left lateral side of buttock--shooting pain down to the knee, throbbing worse when walking. Tried taking tylenol it help some.

## 2019-12-29 NOTE — ED Provider Notes (Signed)
[facility]    CSN: [id number] Arrival date & time: 12/29/19  1234      History   Chief Complaint Chief Complaint  Patient presents with  . Injury    shooting pain, throbbing    HPI Roseau is a 54 y.o. female.   54 year old female comes in for 2-3 week history of left lateral leg pain. States someone had fell down the stairs and stepped on her left foot. She did not recall specific injury, but may have jerked backwards when this occurred. Since then, has had pain along the lateral left leg from hip to the knee. Pain worsens with ROM and weightbearing. Denies numbness/tingling. Tylenol with some relief.      Past Medical History:  Diagnosis Date  . Chicken pox   . Family history of breast cancer   . Family history of colon cancer   . Family history of ovarian cancer   . Family history of prostate cancer   . Family history of stomach cancer   . History of stomach ulcers   . Hypertension     Patient Active Problem List   Diagnosis Date Noted  . COVID-19 virus infection 04/20/2019  . Genetic testing 08/26/2017  . Family history of colon cancer   . Family history of stomach cancer   . Family history of prostate cancer   . Family history of ovarian cancer   . Family history of breast cancer   . Preventative health care 06/20/2016  . Lateral epicondylitis of right elbow 11/26/2015  . Knee LCL sprain 11/26/2015  . HTN (hypertension) 11/11/2015    Past Surgical History:  Procedure Laterality Date  . ABDOMINAL HYSTERECTOMY  2009   uterine fibroids  TAH BSO  . FOOT SURGERY Right "About 6-7 yrs ago"    OB History   No obstetric history on file.      Home Medications    Prior to Admission medications   Medication Sig Start Date End Date Taking? Authorizing Provider  diltiazem (CARDIZEM CD) 300 MG 24 hr capsule TAKE 1 CAPSULE DAILY 07/30/19  Yes Carollee Herter, Kendrick Fries R, DO  spironolactone (ALDACTONE) 25 MG tablet TAKE 1 TABLET DAILY 11/26/19   Yes Ann Held, DO  predniSONE (DELTASONE) 50 MG tablet Take 1 tablet (50 mg total) by mouth daily with breakfast. 12/29/19   Tasia Catchings, Hamlin Devine V, PA-C  tiZANidine (ZANAFLEX) 2 MG tablet Take 1 tablet (2 mg total) by mouth every 8 (eight) hours as needed for muscle spasms. 12/29/19   Ok Edwards, PA-C    Family History Family History  Problem Relation Age of Onset  . Hypertension Mother   . Gout Mother   . Glaucoma Mother   . Hypertension Father   . Heart disease Father   . Hematuria Father   . Stomach cancer Father 79  . Prostate cancer Maternal Uncle 61       metastatic  . Colon cancer Maternal Aunt 64  . Diabetes Maternal Grandmother   . Diabetes Maternal Aunt   . Ovarian cancer Maternal Aunt 62  . Breast cancer Maternal Aunt 68  . Lung cancer Maternal Aunt   . Esophageal cancer Neg Hx   . Rectal cancer Neg Hx     Social History Social History   Tobacco Use  . Smoking status: Never Smoker  . Smokeless tobacco: Never Used  Substance Use Topics  . Alcohol use: Yes    Alcohol/week: 0.0 standard drinks  Comment: rare  . Drug use: No     Allergies   Patient has no known allergies.   Review of Systems Review of Systems  Reason unable to perform ROS: See HPI as above.     Physical Exam Triage Vital Signs ED Triage Vitals  Enc Vitals Group     BP 12/29/19 1331 116/78     Pulse Rate 12/29/19 1331 64     Resp 12/29/19 1331 14     Temp 12/29/19 1331 98.7 F (37.1 C)     Temp Source 12/29/19 1331 Oral     SpO2 12/29/19 1331 96 %     Weight --      Height --      Head Circumference --      Peak Flow --      Pain Score 12/29/19 1259 8     Pain Loc --      Pain Edu? --      Excl. in [place]? --    No data found.  Updated Vital Signs BP 116/78 (BP Location: Left Arm)   Pulse 64   Temp 98.7 F (37.1 C) (Oral)   Resp 14   SpO2 96%   Physical Exam Constitutional:      General: She is not in acute distress.    Appearance: She is well-developed. She is not  diaphoretic.  HENT:     Head: Normocephalic and atraumatic.  Eyes:     Conjunctiva/sclera: Conjunctivae normal.     Pupils: Pupils are equal, round, and reactive to light.  Cardiovascular:     Rate and Rhythm: Normal rate and regular rhythm.     Heart sounds: Normal heart sounds. No murmur heard.  No friction rub. No gallop.   Pulmonary:     Effort: Pulmonary effort is normal. No accessory muscle usage or respiratory distress.     Breath sounds: Normal breath sounds. No stridor. No decreased breath sounds, wheezing, rhonchi or rales.  Musculoskeletal:     Comments: No tenderness on palpation of the spinous processes. no tenderness to the back, hips. Tenderness along lateral thigh. No tenderness to the knee. Full ROM of hip, knee. Negative straight leg raise  Skin:    General: Skin is warm and dry.  Neurological:     Mental Status: She is alert and oriented to person, place, and time.      UC Treatments / Results  Labs (all labs ordered are listed, but only abnormal results are displayed) Labs Reviewed - No data to display  EKG   Radiology No results found.  Procedures Procedures (including critical care time)  Medications Ordered in UC Medications - No data to display  Initial Impression / Assessment and Plan / UC Course  I have reviewed the triage vital signs and the nursing notes.  Pertinent labs & imaging results that were available during my care of the patient were reviewed by me and considered in my medical decision making (see chart for details).    No joint tenderness, likely tendinitis/muscular pain. Prednisone, muscle relaxant. Return precautions given.  Final Clinical Impressions(s) / UC Diagnoses   Final diagnoses:  Leg pain, lateral, left    ED Prescriptions    Medication Sig Dispense Auth. Provider   predniSONE (DELTASONE) 50 MG tablet Take 1 tablet (50 mg total) by mouth daily with breakfast. 5 tablet Amarri Michaelson V, PA-C   tiZANidine (ZANAFLEX) 2 MG  tablet Take 1 tablet (2 mg total) by mouth every 8 (eight) hours  as needed for muscle spasms. 15 tablet Ok Edwards, PA-C     PDMP not reviewed this encounter.   Ok Edwards, PA-C 12/29/19 1349

## 2020-01-08 ENCOUNTER — Encounter: Payer: Self-pay | Admitting: Family Medicine

## 2020-01-08 ENCOUNTER — Ambulatory Visit (HOSPITAL_BASED_OUTPATIENT_CLINIC_OR_DEPARTMENT_OTHER)
Admission: RE | Admit: 2020-01-08 | Discharge: 2020-01-08 | Disposition: A | Discharge status: Home / Self Care | Payer: Managed Care, Other (non HMO) | Source: Ambulatory Visit | Attending: Family Medicine | Admitting: Family Medicine

## 2020-01-08 ENCOUNTER — Ambulatory Visit (INDEPENDENT_AMBULATORY_CARE_PROVIDER_SITE_OTHER): Payer: Managed Care, Other (non HMO) | Admitting: Family Medicine

## 2020-01-08 ENCOUNTER — Other Ambulatory Visit: Payer: Self-pay

## 2020-01-08 VITALS — BP 130/80 | HR 76 | Temp 99.0°F | Resp 18 | Ht 67.0 in | Wt 213.8 lb

## 2020-01-08 DIAGNOSIS — M5442 Lumbago with sciatica, left side: Secondary | ICD-10-CM

## 2020-01-08 MED ORDER — METHOCARBAMOL 500 MG PO TABS: 500.0000 mg | ORAL_TABLET | Freq: Four times a day (QID) | ORAL | 1 refills | Status: DC | PRN

## 2020-01-08 NOTE — Patient Instructions (Signed)
Muscle Cramps and Spasms Muscle cramps and spasms are when muscles tighten by themselves. They usually get better within minutes. Muscle cramps are painful. They are usually stronger and last longer than muscle spasms. Muscle spasms may or may not be painful. They can last a few seconds or much longer. Cramps and spasms can affect any muscle, but they occur most often in the calf muscles of the leg. They are usually not caused by a serious problem. In many cases, the cause is not known. Some common causes include:  Doing more physical work or exercise than your body is ready for.  Using the muscles too much (overuse) by repeating certain movements too many times.  Staying in a certain position for a Afshar time.  Playing a sport or doing an activity without preparing properly.  Using bad form or technique while playing a sport or doing an activity.  Not having enough water in your body (dehydration).  Injury.  Side effects of some medicines.  Low levels of the salts and minerals in your blood (electrolytes), such as low potassium or calcium. Follow these instructions at home: Managing pain and stiffness      Massage, stretch, and relax the muscle. Do this for many minutes at a time.  If told, put heat on tight or tense muscles as often as told by your doctor. Use the heat source that your doctor recommends, such as a moist heat pack or a heating pad. ? Place a towel between your skin and the heat source. ? Leave the heat on for 20-30 minutes. ? Remove the heat if your skin turns bright red. This is very important if you are not able to feel pain, heat, or cold. You may have a greater risk of getting burned.  If told, put ice on the affected area. This may help if you are sore or have pain after a cramp or spasm. ? Put ice in a plastic bag. ? Place a towel between your skin and the bag. ? Leave the ice on for 20 minutes, 2-3 times a day.  Try taking hot showers or baths to help  relax tight muscles. Eating and drinking  Drink enough fluid to keep your pee (urine) pale yellow.  Eat a healthy diet to help ensure that your muscles work well. This should include: ? Fruits and vegetables. ? Lean protein. ? Whole grains. ? Low-fat or nonfat dairy products. General instructions  If you are having cramps often, avoid intense exercise for several days.  Take over-the-counter and prescription medicines only as told by your doctor.  Watch for any changes in your symptoms.  Keep all follow-up visits as told by your doctor. This is important. Contact a doctor if:  Your cramps or spasms get worse or happen more often.  Your cramps or spasms do not get better with time. Summary  Muscle cramps and spasms are when muscles tighten by themselves. They usually get better within minutes.  Cramps and spasms occur most often in the calf muscles of the leg.  Massage, stretch, and relax the muscle. This may help the cramp or spasm go away.  Drink enough fluid to keep your pee (urine) pale yellow. This information is not intended to replace advice given to you by your health care provider. Make sure you discuss any questions you have with your health care provider. Document Revised: 08/22/2017 Document Reviewed: 08/22/2017 Elsevier Patient Education  2020 Elsevier Inc.  

## 2020-01-08 NOTE — Progress Notes (Signed)
Patient ID: Tamara Morton, female    DOB: [birth year]  Age: 54 y.o. 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   Pt pulled her foot away--- her foot hurt her right away but her leg hurt her a few days later as well as her back ---- she was given pred and zanaflex but did not take prednisone     Review of Systems  Constitutional: Negative for appetite change, diaphoresis, fatigue and unexpected weight change.  Eyes: Negative for pain, redness and visual disturbance.  Respiratory: Negative for cough, chest tightness, shortness of breath and wheezing.   Cardiovascular: Negative for chest pain, palpitations and leg swelling.  Endocrine: Negative for cold intolerance, heat intolerance, polydipsia, polyphagia and polyuria.  Genitourinary: Negative for difficulty urinating, dysuria and frequency.  Musculoskeletal: Positive for back pain, gait problem and myalgias. Negative for joint swelling.  Neurological: Negative for dizziness, light-headedness, numbness and headaches.    History Past Medical History:  Diagnosis Date  . Chicken pox   . Family history of breast cancer   . Family history of colon cancer   . Family history of ovarian cancer   . Family history of prostate cancer   . Family history of stomach cancer   . History of stomach ulcers   . Hypertension     She has a past surgical history that includes Foot surgery (Right, "About 6-7 yrs ago") and Abdominal hysterectomy (2009).   Her family history includes Breast cancer (age of onset: 41) in her maternal aunt; Colon cancer (age of onset: 37) in her maternal aunt; Diabetes in her maternal aunt and maternal grandmother; Glaucoma in her mother; Gout in her mother; Heart disease in her father; Hematuria in her father; Hypertension in her father and mother; Lung cancer in her  maternal aunt; Ovarian cancer (age of onset: 37) in her maternal aunt; Prostate cancer (age of onset: 59) in her maternal uncle; Stomach cancer (age of onset: 86) in her father.She reports that she has never smoked. She has never used smokeless tobacco. She reports current alcohol use. She reports that she does not use drugs.  Current Outpatient Medications on File Prior to Visit  Medication Sig Dispense Refill  . diltiazem (CARDIZEM CD) 300 MG 24 hr capsule TAKE 1 CAPSULE DAILY 90 capsule 1  . spironolactone (ALDACTONE) 25 MG tablet TAKE 1 TABLET DAILY 90 tablet 1  . predniSONE (DELTASONE) 50 MG tablet Take 1 tablet (50 mg total) by mouth daily with breakfast. (Patient not taking: Reported on 01/08/2020) 5 tablet 0   No current facility-administered medications on file prior to visit.     Objective:  Objective  Physical Exam Vitals and nursing note reviewed.  Constitutional:      Appearance: She is well-developed.  HENT:     Head: Normocephalic and atraumatic.  Eyes:     Conjunctiva/sclera: Conjunctivae normal.  Neck:     Thyroid: No thyromegaly.     Vascular: No carotid bruit or JVD.  Cardiovascular:     Rate and Rhythm: Normal rate and regular rhythm.     Heart sounds: Normal heart sounds. No murmur heard.   Pulmonary:     Effort: Pulmonary effort is normal. No respiratory distress.     Breath sounds: Normal breath sounds. No wheezing or rales.  Chest:     Chest wall: No tenderness.  Musculoskeletal:        General: Tenderness and signs of injury present. No deformity.     Cervical back: Normal range of motion and neck supple.     Lumbar back: Tenderness present. No swelling, deformity, lacerations or spasms. Decreased range of motion. Positive left straight leg raise test. Negative right straight leg raise test.     Left upper leg: Tenderness present. No swelling, edema, deformity or bony tenderness.     Right lower leg: Normal. No edema.     Left lower leg: Normal. No  edema.  Neurological:     Mental Status: She is alert and oriented to person, place, and time.     Motor: No weakness.     Coordination: Coordination normal.     Gait: Gait abnormal.     Deep Tendon Reflexes: Reflexes normal.    BP 130/80 (BP Location: Left Arm, Patient Position: Sitting, Cuff Size: Large)   Pulse 76   Temp 99 F (37.2 C) (Oral)   Resp 18   Ht 5\' 7"  (1.702 m)   Wt 213 lb 12.8 oz (97 kg)   SpO2 98%   BMI 33.49 kg/m  Wt Readings from Last 3 Encounters:  01/08/20 213 lb 12.8 oz (97 kg)  04/20/19 210 lb (95.3 kg)  10/03/18 214 lb (97.1 kg)     Lab Results  Component Value Date   WBC 4.9 06/24/2017   HGB 14.0 06/24/2017   HCT 41.6 06/24/2017   PLT 281.0 06/24/2017   GLUCOSE 95 10/03/2018   CHOL 143 10/03/2018   TRIG 61.0 10/03/2018   HDL 51.00 10/03/2018   LDLCALC 80 10/03/2018   ALT 29 10/03/2018   AST 21 10/03/2018   NA 139 10/03/2018   K 3.9 10/03/2018   CL 106 10/03/2018   CREATININE 1.10 10/03/2018   BUN 15 10/03/2018   CO2 25 10/03/2018   TSH 2.52 06/24/2017   MICROALBUR <0.7 05/09/2015    No results found.   Assessment & Plan:  Plan  I have discontinued Perlita L. Stephanie's tiZANidine. I am also having her start on methocarbamol. Additionally, I am having her maintain her diltiazem, spironolactone, and predniSONE.  Meds ordered this encounter  Medications  . methocarbamol (ROBAXIN) 500 MG tablet    Sig: Take 1 tablet (500 mg total) by mouth every 6 (six) hours as needed for muscle spasms.    Dispense:  45 tablet    Refill:  1    Problem List Items Addressed This Visit    None    Visit Diagnoses    Acute left-sided low back pain with left-sided sciatica    -  Primary   Relevant Medications   methocarbamol (ROBAXIN) 500 MG tablet   Other Relevant Orders   DG Lumbar Spine Complete    pt will take the pred she has at home Muscle relaxer changed Check ls xray If no improvement ---  Consider sport med referral   Follow-up: No  follow-ups on file.  Ann Held, DO

## 2020-01-10 ENCOUNTER — Ambulatory Visit: Payer: Managed Care, Other (non HMO) | Admitting: Family Medicine

## 2020-02-22 ENCOUNTER — Ambulatory Visit (INDEPENDENT_AMBULATORY_CARE_PROVIDER_SITE_OTHER): Payer: Managed Care, Other (non HMO) | Admitting: Family Medicine

## 2020-02-22 ENCOUNTER — Encounter: Payer: Self-pay | Admitting: Family Medicine

## 2020-02-22 ENCOUNTER — Ambulatory Visit: Payer: Self-pay

## 2020-02-22 ENCOUNTER — Other Ambulatory Visit: Payer: Self-pay

## 2020-02-22 DIAGNOSIS — M79652 Pain in left thigh: Secondary | ICD-10-CM | POA: Diagnosis not present

## 2020-02-22 MED ORDER — CELECOXIB 200 MG PO CAPS: 200.0000 mg | ORAL_CAPSULE | Freq: Two times a day (BID) | ORAL | 6 refills | Status: DC | PRN

## 2020-02-22 MED ORDER — TIZANIDINE HCL 2 MG PO TABS: 2.0000 mg | ORAL_TABLET | Freq: Four times a day (QID) | ORAL | 1 refills | Status: DC | PRN

## 2020-02-22 MED ORDER — VITAMIN D-3 125 MCG (5000 UT) PO TABS: 1.0000 | ORAL_TABLET | Freq: Every day | ORAL | 3 refills | Status: DC

## 2020-02-22 NOTE — Progress Notes (Signed)
Office Visit Note   Patient: Tamara Morton           Date of Birth: [birth year]           MRN: [id number] Visit Date: 02/22/2020 Requested by: [address] [place], [place] [address] [place],  [place] [address] PCP: Carollee Herter, Alferd Apa, DO  Subjective: Chief Complaint  Patient presents with  . Pain in left thigh x > 1 month    HPI: She is here with left side pain and leg pain.  About 2 months ago somebody fell and landed on her left foot.  She thinks she tripped backwards, and has had pain in her left side and leg since then.  It is especially painful when standing and walking, but she has some degree of pain most of the time.  Occasional numbness and tingling in her leg.  No bowel or bladder dysfunction.  She tried a short course of prednisone, and Robaxin but pain did not seem to improve.  She had x-rays of the lumbar spine at urgent care which showed advanced for age L4-5 and L5-S1 degenerative disc disease.  No previous problems with her left leg.  She is otherwise been in good health, blood pressure has been well controlled.  She works for [employer].  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                ROS:   All other systems were reviewed and are negative.  Objective: Vital Signs: There were no vitals taken for this visit.  Physical Exam:  General:  Alert and oriented, in no acute distress. Pulm:  Breathing unlabored. Psy:  Normal mood, congruent affect. Skin:  No bruising  Low back: She has no midline tenderness in the lumbar spine.  There is slight tenderness over the left SI joint.  She has tender myofascial trigger points in the left abdominal obliques.  No pain with internal hip rotation, negative straight leg raise.  5/5 hip flexion, knee extension, foot and ankle strength with 2+ knee and ankle DTRs.  Imaging: No results found.  Assessment & Plan: 1.  Myofascial left abdominal pain with other symptoms concerning for lumbar disc  protrusion -Discussed options with her and elected to try physical therapy in [facility].  We will try Zanaflex and Celebrex as needed. -If not improving after couple weeks she will contact me and I will order lumbar MRI scan followed by epidural injection if indicated.     Procedures: No procedures performed  No notes on file     PMFS History: Patient Active Problem List   Diagnosis Date Noted  . COVID-19 virus infection 04/20/2019  . Genetic testing 08/26/2017  . Family history of colon cancer   . Family history of stomach cancer   . Family history of prostate cancer   . Family history of ovarian cancer   . Family history of breast cancer   . Preventative health care 06/20/2016  . Lateral epicondylitis of right elbow 11/26/2015  . Knee LCL sprain 11/26/2015  . HTN (hypertension) 11/11/2015   Past Medical History:  Diagnosis Date  . Chicken pox   . Family history of breast cancer   . Family history of colon cancer   . Family history of ovarian cancer   . Family history of prostate cancer   . Family history of stomach cancer   . History of stomach ulcers   . Hypertension  Family History  Problem Relation Age of Onset  . Hypertension Mother   . Gout Mother   . Glaucoma Mother   . Hypertension Father   . Heart disease Father   . Hematuria Father   . Stomach cancer Father 55  . Prostate cancer Maternal Uncle 61       metastatic  . Colon cancer Maternal Aunt 25  . Diabetes Maternal Grandmother   . Diabetes Maternal Aunt   . Ovarian cancer Maternal Aunt 62  . Breast cancer Maternal Aunt 68  . Lung cancer Maternal Aunt   . Esophageal cancer Neg Hx   . Rectal cancer Neg Hx     Past Surgical History:  Procedure Laterality Date  . ABDOMINAL HYSTERECTOMY  2009   uterine fibroids  TAH BSO  . FOOT SURGERY Right "About 6-7 yrs ago"   Social History   Occupational History  . Occupation: Designer, industrial/product    Comment: [employer]  Tobacco Use  . Smoking  status: Never Smoker  . Smokeless tobacco: Never Used  Substance and Sexual Activity  . Alcohol use: Yes    Alcohol/week: 0.0 standard drinks    Comment: rare  . Drug use: No  . Sexual activity: Never

## 2020-03-13 ENCOUNTER — Ambulatory Visit: Payer: Managed Care, Other (non HMO) | Attending: Family Medicine | Admitting: Physical Therapy

## 2020-03-13 ENCOUNTER — Encounter: Payer: Self-pay | Admitting: Physical Therapy

## 2020-03-13 ENCOUNTER — Other Ambulatory Visit: Payer: Self-pay

## 2020-03-13 DIAGNOSIS — R29898 Other symptoms and signs involving the musculoskeletal system: Secondary | ICD-10-CM | POA: Insufficient documentation

## 2020-03-13 DIAGNOSIS — M5442 Lumbago with sciatica, left side: Secondary | ICD-10-CM | POA: Diagnosis present

## 2020-03-13 DIAGNOSIS — M25552 Pain in left hip: Secondary | ICD-10-CM | POA: Insufficient documentation

## 2020-03-13 DIAGNOSIS — R262 Difficulty in walking, not elsewhere classified: Secondary | ICD-10-CM | POA: Diagnosis present

## 2020-03-13 NOTE — Therapy (Signed)
[facility] [address]  [place], [place], [address] Phone: [phone]   Fax:  [phone]  Physical Therapy Evaluation  Patient Details  Name: Tamara Morton MRN: [id number] Date of Birth: Feb 27, 1966 Referring Provider (PT): Eunice Blase, MD   Encounter Date: 03/13/2020   PT End of Session - 03/13/20 1808    Visit Number 1    Number of Visits 13    Date for PT Re-Evaluation 04/24/20    Authorization Type [employer]    Authorization - Visit Number 1    Authorization - Number of Visits 20    PT Start Time 5573    PT Stop Time 1609    PT Time Calculation (min) 35 min    Activity Tolerance Patient tolerated treatment well    Behavior During Therapy [facility] for tasks assessed/performed           Past Medical History:  Diagnosis Date  . Chicken pox   . Family history of breast cancer   . Family history of colon cancer   . Family history of ovarian cancer   . Family history of prostate cancer   . Family history of stomach cancer   . History of stomach ulcers   . Hypertension     Past Surgical History:  Procedure Laterality Date  . ABDOMINAL HYSTERECTOMY  2009   uterine fibroids  TAH BSO  . FOOT SURGERY Right "About 6-7 yrs ago"    There were no vitals filed for this visit.    Subjective Assessment - 03/13/20 1535    Subjective Patient reports that she has been having pain in the L thigh for the past 2 months. This initially began after someone fell on her L foot as she believes she jerked in response to this. Worse with standing, walking, and bending forward. MD told her that it was from a pinched nerve in her back. Has been feeling a bit better with meds. Notes N/T down the L anterior shin. Notes that her legs fall asleep if she sits on the toilet for too Patel.Denies B&B changes. Notes intermittent pinching in the L LB.    Pertinent History HTN, R foot surgery    Limitations  Sitting;Lifting;Standing;Walking;House hold activities    How Witty can you sit comfortably? couple hours    How Dolinsky can you stand comfortably? 30 min- 1 hour    How Grimm can you walk comfortably? 30 min- 1 hour    Diagnostic tests 01/08/20 lumbar xray: Disc degeneration and spondylosis L4-5.  Negative for fracture    Patient Stated Goals decrease pain    Currently in Pain? Yes    Pain Score 5     Pain Location --   thigh   Pain Orientation Left;Anterior;Lateral    Pain Descriptors / Indicators Aching;Throbbing    Pain Type Acute pain              [facility] PT Assessment - 03/13/20 1542      Assessment   Medical Diagnosis Pain of L thigh    Referring Provider (PT) Eunice Blase, MD    Onset Date/Surgical Date 01/12/20    Next MD Visit not scheduled    Prior Therapy no      Precautions   Precautions None      Balance Screen   Has the patient fallen in the past 6 months No    Has the patient had a decrease in activity level because of a  fear of falling?  No    Is the patient reluctant to leave their home because of a fear of falling?  No      Home Environment   Living Environment Private residence    Living Arrangements Alone    Available Help at Discharge Family;Friend(s)    Type of [place] to enter    Entrance Stairs-Number of Steps 5    Entrance Stairs-Rails Right;Left    Home Layout Two level    Alternate Level Stairs-Number of Steps 13    Alternate [place] - 2 wheels;Cane - single point;Crutches      Prior Function   Level of Independence Independent    Vocation Full time employment    Vocation Requirements walking, computer work    Leisure none      Cognition   Overall Cognitive Status Within Functional Limits for tasks assessed      Sensation   Light Touch Appears Intact      Coordination   Gross Motor Movements are Fluid and Coordinated Yes      Posture/Postural Control    Posture/Postural Control Postural limitations    Postural Limitations Rounded Shoulders;Forward head;Posterior pelvic tilt   in sitting     ROM / Strength   AROM / PROM / Strength AROM;Strength      AROM   AROM Assessment Site Lumbar    Lumbar Flexion distal shin   "slioght pull" in LB   Lumbar Extension moderately limited   slight pull in L anterior hip   Lumbar - Right Side Bend ditsl thigh    Lumbar - Left Side Bend distal thigh    Lumbar - Right Rotation WFL   L lateral lower leg pain   Lumbar - Left Rotation [facility]      Strength   Strength Assessment Site Hip;Knee;Ankle    Right/Left Hip Right;Left    Right Hip Flexion 4+/5    Right Hip ABduction 4+/5    Right Hip ADduction 4+/5    Left Hip Flexion 4/5    Left Hip ABduction 4/5   TTP lateral knee   Left Hip ADduction 4+/5    Right/Left Knee Right;Left    Right Knee Flexion 5/5    Right Knee Extension 5/5    Left Knee Flexion 4/5   TTP over medial calf   Left Knee Extension 4/5    Right/Left Ankle Right;Left    Right Ankle Dorsiflexion 4+/5    Right Ankle Plantar Flexion 4+/5    Left Ankle Dorsiflexion 4/5    Left Ankle Plantar Flexion 4+/5      Palpation   SI assessment  TTP with central PAs over L4 and L5    Palpation comment TTP over mid length of L ITB down to lateral knee, L proximal posterior tib; palpable trigger point mid L rectus femoris; soft tissue restriction in B lumbar paraspinals; TTP L piriformis      Special Tests    Special Tests --   B SLR negative     Ambulation/Gait   Assistive device None    Gait Pattern Step-through pattern;Decreased stance time - left;Lateral trunk lean to left    Ambulation Surface Level;Indoor    Gait velocity slightly decreased                      Objective measurements completed on examination: See above findings.  PT Education - 03/13/20 1808    Education Details prognosis, POC, HEP    Person(s) Educated Patient    Methods  Explanation;Demonstration;Tactile cues;Handout;Verbal cues    Comprehension Verbalized understanding;Returned demonstration            PT Short Term Goals - 03/13/20 1813      PT SHORT TERM GOAL #1   Title Patient to be independent with initial HEP.    Time 2    Period Weeks    Status New    Target Date 03/27/20             PT Rickey Term Goals - 03/13/20 1814      PT Deford TERM GOAL #1   Title Patient to be independent with advanced HEP.    Time 6    Period Weeks    Status New    Target Date 04/24/20      PT Hosein TERM GOAL #2   Title Patient to demonstrate lumbar AROM WFL and without pain limiting.    Time 6    Period Weeks    Status New    Target Date 04/24/20      PT Kurihara TERM GOAL #3   Title Patient to demonstrate B LE strength >/=4+/5.    Time 6    Period Weeks    Status New    Target Date 04/24/20      PT Atkison TERM GOAL #4   Title Patient to report centralization of L thigh pain to LB.    Time 6    Period Weeks    Status New    Target Date 04/24/20      PT Vasco TERM GOAL #5   Title Patient to report tolerance of walking/standing without pain limiting.    Time 6    Period Weeks    Status New    Target Date 04/24/20                  Plan - 03/13/20 1809    Clinical Impression Statement Patient is a 54 y/o F presenting to OPPT with c/o L sided LBP with radiation down the L thigh for the past 2 months. Notes that the pain first began after someone fell on her L foot, causing her to jerk her back. Pain is worse with walking, standing, and forward flexion. Notes N/T in the L anterior shin but denies B&B changes. Patient today presenting with rounded and forward head posture, limited and painful lumbar AROM, decreased L LE strength, TTP with central PAs over L4 and L5, TTP over L thigh, buttock, and LB, and gait deviations. Patient was educated on gentle extension-based and strengthening HEP- patient reported understanding. Would benefit from skilled  PT services 2x/week for 6 weeks to address aforementioned impairments.    Personal Factors and Comorbidities Age;Comorbidity 2;Fitness;Past/Current Experience;Profession;Time since onset of injury/illness/exacerbation    Comorbidities HTN, R foot surgery    Examination-Activity Limitations Bend;Squat;Stairs;Carry;Stand;Toileting;Dressing;Transfers;Hygiene/Grooming;Lift;Locomotion Level    Examination-Participation Restrictions Church;Cleaning;School;Shop;Driving;Yard Work;Laundry;Occupation;Meal Prep    Stability/Clinical Decision Making Stable/Uncomplicated    Clinical Decision Making Low    Rehab Potential Good    PT Frequency 2x / week    PT Duration 6 weeks    PT Treatment/Interventions ADLs/Self Care Home Management;Cryotherapy;Electrical Stimulation;Iontophoresis 4mg /ml Dexamethasone;Moist Heat;Traction;Balance training;Therapeutic exercise;Therapeutic activities;Functional mobility training;Stair training;Gait training;Ultrasound;Neuromuscular re-education;Patient/family education;Manual techniques;Taping;Energy conservation;Dry needling;Passive range of motion    PT Next Visit Plan lumbar FOTO; progress lumbar extension based ther-ex, L lateral hip stretching and strengthening  Consulted and Agree with Plan of Care Patient           Patient will benefit from skilled therapeutic intervention in order to improve the following deficits and impairments:  Decreased activity tolerance, Decreased strength, Increased fascial restricitons, Pain, Difficulty walking, Increased muscle spasms, Improper body mechanics, Decreased range of motion, Impaired flexibility, Postural dysfunction  Visit Diagnosis: Acute left-sided low back pain with left-sided sciatica  Pain in left hip  Difficulty in walking, not elsewhere classified  Other symptoms and signs involving the musculoskeletal system     Problem List Patient Active Problem List   Diagnosis Date Noted  . COVID-19 virus infection  04/20/2019  . Genetic testing 08/26/2017  . Family history of colon cancer   . Family history of stomach cancer   . Family history of prostate cancer   . Family history of ovarian cancer   . Family history of breast cancer   . Preventative health care 06/20/2016  . Lateral epicondylitis of right elbow 11/26/2015  . Knee LCL sprain 11/26/2015  . HTN (hypertension) 11/11/2015     Janene Harvey, PT, DPT 03/13/20 [facility] [address]  [place], [place], [address] Phone: [phone]   Fax:  [phone]  Name: Tamara Morton MRN: [id number] Date of Birth: [birth year]

## 2020-03-17 ENCOUNTER — Other Ambulatory Visit: Payer: Self-pay

## 2020-03-17 ENCOUNTER — Ambulatory Visit: Payer: Managed Care, Other (non HMO) | Admitting: Physical Therapy

## 2020-03-17 ENCOUNTER — Encounter: Payer: Self-pay | Admitting: Physical Therapy

## 2020-03-17 DIAGNOSIS — M5442 Lumbago with sciatica, left side: Secondary | ICD-10-CM | POA: Diagnosis not present

## 2020-03-17 DIAGNOSIS — M25552 Pain in left hip: Secondary | ICD-10-CM

## 2020-03-17 DIAGNOSIS — R262 Difficulty in walking, not elsewhere classified: Secondary | ICD-10-CM

## 2020-03-17 DIAGNOSIS — R29898 Other symptoms and signs involving the musculoskeletal system: Secondary | ICD-10-CM

## 2020-03-17 NOTE — Therapy (Signed)
[facility] [address]  [place], [place], [address] Phone: [phone]   Fax:  [phone]  Physical Therapy Treatment  Patient Details  Name: Tamara Morton MRN: [id number] Date of Birth: [birth year] Referring Provider (PT): Eunice Blase, MD   Encounter Date: 03/17/2020   PT End of Session - 03/17/20 1615    Visit Number 2    Number of Visits 13    Date for PT Re-Evaluation 04/24/20    Authorization Type Cigna    Authorization - Visit Number 2    Authorization - Number of Visits 20    PT Start Time 1528    PT Stop Time 5361    PT Time Calculation (min) 45 min    Activity Tolerance Patient tolerated treatment well    Behavior During Therapy [facility] for tasks assessed/performed           Past Medical History:  Diagnosis Date  . Chicken pox   . Family history of breast cancer   . Family history of colon cancer   . Family history of ovarian cancer   . Family history of prostate cancer   . Family history of stomach cancer   . History of stomach ulcers   . Hypertension     Past Surgical History:  Procedure Laterality Date  . ABDOMINAL HYSTERECTOMY  2009   uterine fibroids  TAH BSO  . FOOT SURGERY Right "About 6-7 yrs ago"    There were no vitals filed for this visit.   Subjective Assessment - 03/17/20 1529    Subjective Things have been going fine. Denies questions on HEP.    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  Negative for fracture    Patient Stated Goals decrease pain    Currently in Pain? No/denies              [facility] PT Assessment - 03/17/20 0001      Observation/Other Assessments   Focus on Therapeutic Outcomes (FOTO)  Lumbar: 70 (30% limited, 22% predicted)      AROM   AROM Assessment Site Hip    Right/Left Hip Right;Left    Right Hip Flexion 115    Right Hip External Rotation  42    Right Hip  Internal Rotation  40    Left Hip Flexion 94   lateral hip pain   Left Hip External Rotation  41    Left Hip Internal Rotation  26      Flexibility   Soft Tissue Assessment /Muscle Length yes    ITB B Ober's positive, L>R                         [facility] Adult PT Treatment/Exercise - 03/17/20 0001      Self-Care   Self-Care Other Self-Care Comments    Other Self-Care Comments  edu and practice using self-STM ball on wall to L TFL      Exercises   Exercises Lumbar;Knee/Hip      Lumbar Exercises: Aerobic   Nustep L4 x 6 min (UEs/LEs)      Lumbar Exercises: Prone   Other Prone Lumbar Exercises prone on elbows 10x3"   cues to maintain hips down     Knee/Hip Exercises: Stretches   Ambulance person reps;30 seconds    Quad Stretch Limitations folded pillow under knee    Hip  Flexor Stretch Left;1 rep;30 seconds    Hip Flexor Stretch Limitations mod thomas with strap   unable to tolerate   ITB Stretch Left;2 reps;30 seconds    ITB Stretch Limitations supine with strap   good form     Manual Therapy   Manual Therapy Myofascial release;Soft tissue mobilization    Manual therapy comments supine    Soft tissue mobilization STM to L vastus lateralis, TFL, ITB, rectus flemoris, iliopsoas    Myofascial Release TPR to L TFL, rectus femoris, vastus lateralis   multiple tender trigger pts                 PT Education - 03/17/20 1615    Education Details update to HEP    Person(s) Educated Patient    Methods Explanation;Demonstration;Tactile cues;Handout;Verbal cues    Comprehension Verbalized understanding;Returned demonstration            PT Short Term Goals - 03/17/20 1616      PT SHORT TERM GOAL #1   Title Patient to be independent with initial HEP.    Time 2    Period Weeks    Status Achieved    Target Date 03/27/20             PT Feltes Term Goals - 03/17/20 1616      PT Syverson TERM GOAL #1   Title Patient to be independent with advanced HEP.      Time 6    Period Weeks    Status On-going      PT Venus TERM GOAL #2   Title Patient to demonstrate lumbar AROM WFL and without pain limiting.    Time 6    Period Weeks    Status On-going      PT Ybarra TERM GOAL #3   Title Patient to demonstrate B LE strength >/=4+/5.    Time 6    Period Weeks    Status On-going      PT Delahunt TERM GOAL #4   Title Patient to report centralization of L thigh pain to LB.    Time 6    Period Weeks    Status On-going      PT Suh TERM GOAL #5   Title Patient to report tolerance of walking/standing without pain limiting.    Time 6    Period Weeks    Status On-going                 Plan - 03/17/20 1615    Clinical Impression Statement Patient arrived without new complaints. Denied questions/concerns with HEP. Assessed hip AROM, with patient demonstrating limited and painful L hip flexion AROM. Ober's test was positive B, L>R. Patient able to tolerate prone press ups, but requiring correction of compensations d/t stiffness. Initiated hip flexor stretching with patient demonstrating poor tolerance for supine vs. prone positioning- improved with cues to avoid pulling into pain. Proceeded with STM and TPR to L anterior and lateral hip, which revealed several tender trigger points. Educated patient on use of self-STM for continued pain relief and updated HEP with quad/hip flexor stretch. Patient reported understanding of edu provided today and without complaints at end of session.    Comorbidities HTN, R foot surgery    PT Treatment/Interventions ADLs/Self Care Home Management;Cryotherapy;Electrical Stimulation;Iontophoresis 4mg /ml Dexamethasone;Moist Heat;Traction;Balance training;Therapeutic exercise;Therapeutic activities;Functional mobility training;Stair training;Gait training;Ultrasound;Neuromuscular re-education;Patient/family education;Manual techniques;Taping;Energy conservation;Dry needling;Passive range of motion    PT Next Visit Plan progress  lumbar extension based ther-ex, L lateral hip stretching  and strengthening    Consulted and Agree with Plan of Care Patient           Patient will benefit from skilled therapeutic intervention in order to improve the following deficits and impairments:  Decreased activity tolerance, Decreased strength, Increased fascial restricitons, Pain, Difficulty walking, Increased muscle spasms, Improper body mechanics, Decreased range of motion, Impaired flexibility, Postural dysfunction  Visit Diagnosis: Acute left-sided low back pain with left-sided sciatica  Pain in left hip  Difficulty in walking, not elsewhere classified  Other symptoms and signs involving the musculoskeletal system     Problem List Patient Active Problem List   Diagnosis Date Noted  . COVID-19 virus infection 04/20/2019  . Genetic testing 08/26/2017  . Family history of colon cancer   . Family history of stomach cancer   . Family history of prostate cancer   . Family history of ovarian cancer   . Family history of breast cancer   . Preventative health care 06/20/2016  . Lateral epicondylitis of right elbow 11/26/2015  . Knee LCL sprain 11/26/2015  . HTN (hypertension) 11/11/2015     Janene Harvey, PT, DPT 03/17/20 4:17 PM   [facility] [address]  [address] [place], [place], [address] Phone: [phone]   Fax:  [phone]  Name: Tamara Morton MRN: [id number] Date of Birth: 08/02/1965

## 2020-03-24 ENCOUNTER — Other Ambulatory Visit: Payer: Self-pay

## 2020-03-24 ENCOUNTER — Ambulatory Visit: Payer: Managed Care, Other (non HMO) | Admitting: Physical Therapy

## 2020-03-24 ENCOUNTER — Encounter: Payer: Self-pay | Admitting: Physical Therapy

## 2020-03-24 DIAGNOSIS — R262 Difficulty in walking, not elsewhere classified: Secondary | ICD-10-CM

## 2020-03-24 DIAGNOSIS — M25552 Pain in left hip: Secondary | ICD-10-CM

## 2020-03-24 DIAGNOSIS — M5442 Lumbago with sciatica, left side: Secondary | ICD-10-CM

## 2020-03-24 DIAGNOSIS — R29898 Other symptoms and signs involving the musculoskeletal system: Secondary | ICD-10-CM

## 2020-03-24 NOTE — Therapy (Signed)
[facility] [address]  [place], [place], [address] Phone: [phone]   Fax:  [phone]  Physical Therapy Treatment  Patient Details  Name: Tamara Morton MRN: [id number] Date of Birth: [birth year] Referring Provider (PT): Eunice Blase, MD   Encounter Date: 03/24/2020   PT End of Session - 03/24/20 1709    Visit Number 3    Number of Visits 13    Date for PT Re-Evaluation 04/24/20    Authorization Type [employer]    Authorization - Visit Number 3    Authorization - Number of Visits 20    PT Start Time 1532    PT Stop Time 4854    PT Time Calculation (min) 43 min    Activity Tolerance Patient tolerated treatment well    Behavior During Therapy WFL for tasks assessed/performed           Past Medical History:  Diagnosis Date   Chicken pox    Family history of breast cancer    Family history of colon cancer    Family history of ovarian cancer    Family history of prostate cancer    Family history of stomach cancer    History of stomach ulcers    Hypertension     Past Surgical History:  Procedure Laterality Date   ABDOMINAL HYSTERECTOMY  2009   uterine fibroids  TAH BSO   FOOT SURGERY Right "About 6-7 yrs ago"    There were no vitals filed for this visit.   Subjective Assessment - 03/24/20 1533    Subjective Has been doing okay. Her leg still hurts. Reports compliance with HEP. Notes that massage helped last time.    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  Negative for fracture    Patient Stated Goals decrease pain    Currently in Pain? Yes    Pain Score 5     Pain Location --   thigh   Pain Orientation Left;Anterior;Lateral    Pain Descriptors / Indicators Aching;Throbbing    Pain Type Acute pain                             [facility] Adult PT Treatment/Exercise - 03/24/20 1541       Lumbar Exercises: Stretches   Quad Stretch Left;2 reps;30 seconds    Quad Stretch Limitations prone with strap and folded pillow under knee    ITB Stretch Left;2 reps;30 seconds    ITB Stretch Limitations supine with strap    Piriformis Stretch Left;2 reps;Right;30 seconds    Piriformis Stretch Limitations sitting KTOS      Lumbar Exercises: Supine   Bridge with clamshell 10 reps    Bridge with [employer] Limitations 2x10 with red TB   cues to increase hip ROM and increase eccentric control     Knee/Hip Exercises: Stretches   Hip Flexor Stretch Left;1 rep;30 seconds    Hip Flexor Stretch Limitations mod thomas with strap   poor tolerance     Knee/Hip Exercises: Standing   Hip Abduction Stengthening;Right;Left;1 set;10 reps;Knee straight    Abduction Limitations 2 ski poles; 2nd set with yellow loop   slight lateral trunk lean   Hip Extension Stengthening;Right;Left;1 set;10 reps;Knee straight    Extension Limitations 2 ski poles; with yellow loop   more challenge on L     Knee/Hip  Exercises: Seated   Other Seated Knee/Hip Exercises L ankle dorsiflexion with red TB doubled over x10, green TB doubled over 10x      Manual Therapy   Manual Therapy Myofascial release;Soft tissue mobilization    Manual therapy comments supine    Soft tissue mobilization STM to L vastus lateralis, TFL, ITB, rectus flemoris, iliopsoas    Myofascial Release TPR to L TFL, rectus femoris, vastus lateralis   improved soft tissue restriction since last session                 PT Education - 03/24/20 1617    Education Details update to HEP    Person(s) Educated Patient    Methods Demonstration;Explanation;Tactile cues;Verbal cues;Handout    Comprehension Verbalized understanding;Returned demonstration            PT Short Term Goals - 03/17/20 1616      PT SHORT TERM GOAL #1   Title Patient to be independent with initial HEP.    Time 2    Period Weeks    Status Achieved    Target Date  03/27/20             PT Wojcicki Term Goals - 03/17/20 1616      PT Miguez TERM GOAL #1   Title Patient to be independent with advanced HEP.    Time 6    Period Weeks    Status On-going      PT Goertzen TERM GOAL #2   Title Patient to demonstrate lumbar AROM WFL and without pain limiting.    Time 6    Period Weeks    Status On-going      PT Mejorado TERM GOAL #3   Title Patient to demonstrate B LE strength >/=4+/5.    Time 6    Period Weeks    Status On-going      PT Berish TERM GOAL #4   Title Patient to report centralization of L thigh pain to LB.    Time 6    Period Weeks    Status On-going      PT Cambria TERM GOAL #5   Title Patient to report tolerance of walking/standing without pain limiting.    Time 6    Period Weeks    Status On-going                 Plan - 03/24/20 1710    Clinical Impression Statement Patient reporting continued pain in the L thigh/hip despite report of improvement in pain with STM last session. Reports compliance with HEP. Continued to work on L lateral hip stretching with minor correction of alignment required. Patient still with poor tolerance of modified Thomas stretch d/t L lateral hip and knee discomfort. Proceeded with prone quad stretching which is still better-tolerated. Patient performed standing and mat hip strengthening ther-ex with good ability to tolerate resistance without considerable compensations. Also able to tolerate increased banded resistance with L ankle dorsiflexion today. Ended session with STM and manual TPR to L vastus lateralis and TFL. Patient reporting pain no worse at end of session and reported understanding of HEP update.    Comorbidities HTN, R foot surgery    PT Treatment/Interventions ADLs/Self Care Home Management;Cryotherapy;Electrical Stimulation;Iontophoresis 4mg /ml Dexamethasone;Moist Heat;Traction;Balance training;Therapeutic exercise;Therapeutic activities;Functional mobility training;Stair training;Gait  training;Ultrasound;Neuromuscular re-education;Patient/family education;Manual techniques;Taping;Energy conservation;Dry needling;Passive range of motion    PT Next Visit Plan L lateral hip stretching and strengthening    Consulted and Agree with Plan of Care Patient  Patient will benefit from skilled therapeutic intervention in order to improve the following deficits and impairments:  Decreased activity tolerance,Decreased strength,Increased fascial restricitons,Pain,Difficulty walking,Increased muscle spasms,Improper body mechanics,Decreased range of motion,Impaired flexibility,Postural dysfunction  Visit Diagnosis: Acute left-sided low back pain with left-sided sciatica  Pain in left hip  Difficulty in walking, not elsewhere classified  Other symptoms and signs involving the musculoskeletal system     Problem List Patient Active Problem List   Diagnosis Date Noted   COVID-19 virus infection 04/20/2019   Genetic testing 08/26/2017   Family history of colon cancer    Family history of stomach cancer    Family history of prostate cancer    Family history of ovarian cancer    Family history of breast cancer    Preventative health care 06/20/2016   Lateral epicondylitis of right elbow 11/26/2015   Knee LCL sprain 11/26/2015   HTN (hypertension) 11/11/2015     Janene Harvey, PT, DPT 03/24/20 5:12 PM   [facility] [address]  [place], [place], [address] Phone: [phone]   Fax:  [phone]  Name: CYNARA TATHAM MRN: [id number] Date of Birth: 07-15-1965

## 2020-03-26 ENCOUNTER — Other Ambulatory Visit: Payer: Self-pay

## 2020-03-26 ENCOUNTER — Ambulatory Visit: Payer: Managed Care, Other (non HMO)

## 2020-03-26 DIAGNOSIS — R29898 Other symptoms and signs involving the musculoskeletal system: Secondary | ICD-10-CM

## 2020-03-26 DIAGNOSIS — M5442 Lumbago with sciatica, left side: Secondary | ICD-10-CM | POA: Diagnosis not present

## 2020-03-26 DIAGNOSIS — M25552 Pain in left hip: Secondary | ICD-10-CM

## 2020-03-26 DIAGNOSIS — R262 Difficulty in walking, not elsewhere classified: Secondary | ICD-10-CM

## 2020-03-26 NOTE — Therapy (Signed)
[facility] [address]  [place], [place], [address] Phone: [phone]   Fax:  [phone]  Physical Therapy Treatment  Patient Details  Name: Tamara Morton MRN: [id number] Date of Birth: [birth year] Referring Provider (PT): Eunice Blase, MD   Encounter Date: 03/26/2020   PT End of Session - 03/26/20 1552    Visit Number 4    Number of Visits 13    Date for PT Re-Evaluation 04/24/20    Authorization Type [employer]    Authorization - Visit Number 4    Authorization - Number of Visits 20    PT Start Time 8916    PT Stop Time 1625   Ended visit with 10 min moist heat   PT Time Calculation (min) 54 min    Activity Tolerance Patient tolerated treatment well    Behavior During Therapy [facility] for tasks assessed/performed           Past Medical History:  Diagnosis Date  . Chicken pox   . Family history of breast cancer   . Family history of colon cancer   . Family history of ovarian cancer   . Family history of prostate cancer   . Family history of stomach cancer   . History of stomach ulcers   . Hypertension     Past Surgical History:  Procedure Laterality Date  . ABDOMINAL HYSTERECTOMY  2009   uterine fibroids  TAH BSO  . FOOT SURGERY Right "About 6-7 yrs ago"    There were no vitals filed for this visit.   Subjective Assessment - 03/26/20 1549    Subjective Pt. doing ok.  Performing HEP daily.    Pertinent History HTN, R foot surgery    How Briel can you stand comfortably? 41min-60 min    How Meckley can you walk comfortably? 2 hours    Diagnostic tests 01/08/20 lumbar xray: Disc degeneration and spondylosis L4-5.  Negative for fracture    Patient Stated Goals decrease pain    Currently in Pain? No/denies    Pain Score 1    up to a 5/10 at worse in mornings   Pain Location --   L thigh   Pain Orientation Left;Anterior;Lateral    Pain Descriptors / Indicators Aching;Throbbing    Pain Type Acute pain     Pain Onset More than a month ago    Pain Frequency Intermittent    Aggravating Factors  bending, mornings    Pain Relieving Factors tylenol    Multiple Pain Sites No                             [facility] Adult PT Treatment/Exercise - 03/26/20 0001      Lumbar Exercises: Stretches   Lower Trunk Rotation Limitations 5" x 10    Hip Flexor Stretch Left;1 rep;30 seconds    Hip Flexor Stretch Limitations mod thomas position with strap and LE resting on 8" step    ITB Stretch Left;2 reps;30 seconds    ITB Stretch Limitations supine with strap    Piriformis Stretch Left;2 reps;Right;30 seconds    Piriformis Stretch Limitations sitting KTOS    Figure 4 Stretch 2 reps;30 seconds    Figure 4 Stretch Limitations L      Lumbar Exercises: Aerobic   Nustep L5 x 6 min (UEs/LEs)      Modalities   Modalities Moist Heat      Moist  Heat Therapy   Number Minutes Moist Heat 10 Minutes    Moist Heat Location Hip   L anterior     Manual Therapy   Manual Therapy Soft tissue mobilization    Soft tissue mobilization STM to L vastus lateralis, TFL, rectus flemoris, iliopsoas                    PT Short Term Goals - 03/17/20 1616      PT SHORT TERM GOAL #1   Title Patient to be independent with initial HEP.    Time 2    Period Weeks    Status Achieved    Target Date 03/27/20             PT Rochette Term Goals - 03/17/20 1616      PT Semrad TERM GOAL #1   Title Patient to be independent with advanced HEP.    Time 6    Period Weeks    Status On-going      PT [place] TERM GOAL #2   Title Patient to demonstrate lumbar AROM WFL and without pain limiting.    Time 6    Period Weeks    Status On-going      PT Caras TERM GOAL #3   Title Patient to demonstrate B LE strength >/=4+/5.    Time 6    Period Weeks    Status On-going      PT Schram TERM GOAL #4   Title Patient to report centralization of L thigh pain to LB.    Time 6    Period Weeks    Status On-going       PT Stillman TERM GOAL #5   Title Patient to report tolerance of walking/standing without pain limiting.    Time 6    Period Weeks    Status On-going                 Plan - 03/26/20 1806    Clinical Impression Statement Tamara Morton doing ok.  Is performing HEP.  Progressed LE/hip stretching along with continued MT/STM to thigh musculature for pain relief and improved tissue quality.  Ended visit with moist heat to anterior hip/thigh for relaxation of musculature.     Comorbidities HTN, R foot surgery    Rehab Potential Good    PT Frequency 2x / week    PT Duration 6 weeks    PT Treatment/Interventions ADLs/Self Care Home Management;Cryotherapy;Electrical Stimulation;Iontophoresis 4mg /ml Dexamethasone;Moist Heat;Traction;Balance training;Therapeutic exercise;Therapeutic activities;Functional mobility training;Stair training;Gait training;Ultrasound;Neuromuscular re-education;Patient/family education;Manual techniques;Taping;Energy conservation;Dry needling;Passive range of motion    PT Next Visit Plan L lateral hip stretching and strengthening    Consulted and Agree with Plan of Care Patient           Patient will benefit from skilled therapeutic intervention in order to improve the following deficits and impairments:  Decreased activity tolerance,Decreased strength,Increased fascial restricitons,Pain,Difficulty walking,Increased muscle spasms,Improper body mechanics,Decreased range of motion,Impaired flexibility,Postural dysfunction  Visit Diagnosis: Acute left-sided low back pain with left-sided sciatica  Pain in left hip  Difficulty in walking, not elsewhere classified  Other symptoms and signs involving the musculoskeletal system     Problem List Patient Active Problem List   Diagnosis Date Noted  . COVID-19 virus infection 04/20/2019  . Genetic testing 08/26/2017  . Family history of colon cancer   . Family history of stomach cancer   . Family history of prostate cancer    . Family history of ovarian cancer   .  Family history of breast cancer   . Preventative health care 06/20/2016  . Lateral epicondylitis of right elbow 11/26/2015  . Knee LCL sprain 11/26/2015  . HTN (hypertension) 11/11/2015    Tamara Morton, PTA 03/26/20 6:07 PM   [facility] [address]  [place], [place], [address] Phone: [phone]   Fax:  [phone]  Name: Tamara Morton MRN: [id number] Date of Birth: 22-Feb-1966

## 2020-03-31 ENCOUNTER — Other Ambulatory Visit: Payer: Self-pay

## 2020-03-31 ENCOUNTER — Ambulatory Visit: Payer: Managed Care, Other (non HMO)

## 2020-03-31 DIAGNOSIS — R262 Difficulty in walking, not elsewhere classified: Secondary | ICD-10-CM

## 2020-03-31 DIAGNOSIS — M25552 Pain in left hip: Secondary | ICD-10-CM

## 2020-03-31 DIAGNOSIS — R29898 Other symptoms and signs involving the musculoskeletal system: Secondary | ICD-10-CM

## 2020-03-31 DIAGNOSIS — M5442 Lumbago with sciatica, left side: Secondary | ICD-10-CM | POA: Diagnosis not present

## 2020-03-31 NOTE — Therapy (Signed)
[facility] [address]  [place], [place], [address] Phone: [phone]   Fax:  [phone]  Physical Therapy Treatment  Patient Details  Name: Tamara Morton MRN: [id number] Date of Birth: 04/03/1966 Referring Provider (PT): Eunice Blase, MD   Encounter Date: 03/31/2020   PT End of Session - 03/31/20 1535    Visit Number 5    Number of Visits 13    Date for PT Re-Evaluation 04/24/20    Authorization Type [employer]    Authorization - Visit Number 5    Authorization - Number of Visits 20    PT Start Time 2671    PT Stop Time 1615    PT Time Calculation (min) 44 min    Activity Tolerance Patient tolerated treatment well    Behavior During Therapy [facility] for tasks assessed/performed           Past Medical History:  Diagnosis Date  . Chicken pox   . Family history of breast cancer   . Family history of colon cancer   . Family history of ovarian cancer   . Family history of prostate cancer   . Family history of stomach cancer   . History of stomach ulcers   . Hypertension     Past Surgical History:  Procedure Laterality Date  . ABDOMINAL HYSTERECTOMY  2009   uterine fibroids  TAH BSO  . FOOT SURGERY Right "About 6-7 yrs ago"    There were no vitals filed for this visit.   Subjective Assessment - 03/31/20 1541    Subjective Pt. noting improving pain levels.    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  Negative for fracture    Patient Stated Goals decrease pain    Currently in Pain? Yes    Pain Score 6     Pain Location --   L thigh   Pain Orientation Left;Anterior;Lateral    Pain Descriptors / Indicators Throbbing    Pain Type Acute pain    Pain Onset More than a month ago    Pain Frequency Intermittent    Aggravating Factors  bending forward    Multiple Pain Sites No                              [facility] Adult PT Treatment/Exercise - 03/31/20 0001      Lumbar Exercises: Stretches   Passive Hamstring Stretch Left;30 seconds;2 reps    Passive Hamstring Stretch Limitations supine with strap    Hip Flexor Stretch Left;2 reps;30 seconds    Hip Flexor Stretch Limitations mod thomas position with strap and LE resting on 8" step   and seated position   ITB Stretch Left;2 reps;30 seconds    ITB Stretch Limitations supine with strap      Lumbar Exercises: Aerobic   Nustep L5 x 6 min (UEs/LEs)      Knee/Hip Exercises: Supine   Bridges with Clamshell Both;2 sets;5 reps   B hip isometric hip abd/ER into red TB   Other Supine Knee/Hip Exercises Hooklying B clam shell with red TB x 10 reps      Manual Therapy   Manual Therapy Soft tissue mobilization    Manual therapy comments mod thomas position    Soft tissue mobilization STM to L vastus lateralis, TFL, rectus flemoris, iliopsoas  PT Education - 03/31/20 1627    Education Details HEP update    Person(s) Educated Patient    Methods Explanation;Demonstration;Verbal cues;Handout    Comprehension Verbalized understanding;Returned demonstration;Verbal cues required            PT Short Term Goals - 03/17/20 1616      PT SHORT TERM GOAL #1   Title Patient to be independent with initial HEP.    Time 2    Period Weeks    Status Achieved    Target Date 03/27/20             PT Veazie Term Goals - 03/17/20 1616      PT Mussell TERM GOAL #1   Title Patient to be independent with advanced HEP.    Time 6    Period Weeks    Status On-going      PT Failla TERM GOAL #2   Title Patient to demonstrate lumbar AROM WFL and without pain limiting.    Time 6    Period Weeks    Status On-going      PT Hayse TERM GOAL #3   Title Patient to demonstrate B LE strength >/=4+/5.    Time 6    Period Weeks    Status On-going      PT Buendia TERM GOAL #4   Title Patient to report centralization of L thigh pain to LB.    Time 6     Period Weeks    Status On-going      PT Heavin TERM GOAL #5   Title Patient to report tolerance of walking/standing without pain limiting.    Time 6    Period Weeks    Status On-going                 Plan - 03/31/20 1546    Clinical Impression Statement Latisa Doing ok noting significant improvement in pain since starting therapy.  Denies LBP today however does complaint of L anterior thigh pain.  continues to present with severe tightness in hip flexors L>R thus progressed various hip flexor stretching along with MT focused on STM/DTM to improve anterior hip tissue quality to reduce tenderness.  Ended visit pain free.    Comorbidities HTN, R foot surgery    Rehab Potential Good    PT Frequency 2x / week    PT Duration 6 weeks    PT Treatment/Interventions ADLs/Self Care Home Management;Cryotherapy;Electrical Stimulation;Iontophoresis 4mg /ml Dexamethasone;Moist Heat;Traction;Balance training;Therapeutic exercise;Therapeutic activities;Functional mobility training;Stair training;Gait training;Ultrasound;Neuromuscular re-education;Patient/family education;Manual techniques;Taping;Energy conservation;Dry needling;Passive range of motion    PT Next Visit Plan L lateral hip stretching and strengthening    Consulted and Agree with Plan of Care Patient           Patient will benefit from skilled therapeutic intervention in order to improve the following deficits and impairments:  Decreased activity tolerance,Decreased strength,Increased fascial restricitons,Pain,Difficulty walking,Increased muscle spasms,Improper body mechanics,Decreased range of motion,Impaired flexibility,Postural dysfunction  Visit Diagnosis: Acute left-sided low back pain with left-sided sciatica  Pain in left hip  Difficulty in walking, not elsewhere classified  Other symptoms and signs involving the musculoskeletal system     Problem List Patient Active Problem List   Diagnosis Date Noted  .  COVID-19 virus infection 04/20/2019  . Genetic testing 08/26/2017  . Family history of colon cancer   . Family history of stomach cancer   . Family history of prostate cancer   . Family history of ovarian cancer   . Family history  of breast cancer   . Preventative health care 06/20/2016  . Lateral epicondylitis of right elbow 11/26/2015  . Knee LCL sprain 11/26/2015  . HTN (hypertension) 11/11/2015    Bess Harvest, PTA 03/31/20 5:54 PM   [facility] [address]  [place], [place], [address] Phone: [phone]   Fax:  [phone]  Name: Tamara Morton MRN: [id number] Date of Birth: 1965-11-24

## 2020-04-03 ENCOUNTER — Other Ambulatory Visit: Payer: Self-pay

## 2020-04-03 ENCOUNTER — Ambulatory Visit: Payer: Managed Care, Other (non HMO)

## 2020-04-03 DIAGNOSIS — M25552 Pain in left hip: Secondary | ICD-10-CM

## 2020-04-03 DIAGNOSIS — R262 Difficulty in walking, not elsewhere classified: Secondary | ICD-10-CM

## 2020-04-03 DIAGNOSIS — M5442 Lumbago with sciatica, left side: Secondary | ICD-10-CM

## 2020-04-03 DIAGNOSIS — R29898 Other symptoms and signs involving the musculoskeletal system: Secondary | ICD-10-CM

## 2020-04-03 NOTE — Therapy (Signed)
Tamara Morton [place] [address] Tamara Morton  [place], [place], [address] Phone: [phone]   Fax:  [phone]  Physical Therapy Treatment  Patient Details  Name: Tamara Morton MRN: [id number] Date of Birth: 03-06-1966 Referring Provider (PT): Tamara Blase, MD   Encounter Date: 04/03/2020   PT End of Session - 04/03/20 1540    Visit Number 6    Number of Visits 13    Date for PT Re-Evaluation 04/24/20    Authorization Type [employer]    Authorization - Visit Number 6    Authorization - Number of Visits 20    PT Start Time 4259    PT Stop Time 1625   Ended visit with 10 min moist heat   PT Time Calculation (min) 52 min    Activity Tolerance Patient tolerated treatment well    Behavior During Therapy [facility] for tasks assessed/performed           Past Medical History:  Diagnosis Date  . Chicken pox   . Family history of breast cancer   . Family history of colon cancer   . Family history of ovarian cancer   . Family history of prostate cancer   . Family history of stomach cancer   . History of stomach ulcers   . Hypertension     Past Surgical History:  Procedure Laterality Date  . ABDOMINAL HYSTERECTOMY  2009   uterine fibroids  TAH BSO  . FOOT SURGERY Right "About 6-7 yrs ago"    There were no vitals filed for this visit.   Subjective Assessment - 04/03/20 1538    Subjective Pt. reporting some improvement in pain levels.  Note slight centralization of pain up anterior thigh toward hips.    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  Negative for fracture    Patient Stated Goals decrease pain    Currently in Pain? Yes    Pain Score 3     Pain Location --   L anterior thigh   Pain Orientation Left;Anterior;Lateral    Pain Descriptors / Indicators Tightness    Pain Type Acute pain    Pain Onset More than a month ago    Aggravating Factors   Bending forward    Multiple Pain Sites No                             [facility] Adult PT Treatment/Exercise - 04/03/20 0001      Lumbar Exercises: Stretches   Passive Hamstring Stretch Left;30 seconds;2 reps    Passive Hamstring Stretch Limitations supine with strap    Hip Flexor Stretch Left;2 reps;30 seconds    Hip Flexor Stretch Limitations mod thomas position with strap and LE resting on 8" step   L LE on 7" bolster   ITB Stretch Left;2 reps;30 seconds    ITB Stretch Limitations supine with strap    Piriformis Stretch Left;2 reps;Right;30 seconds    Piriformis Stretch Limitations sitting KTOS    Figure 4 Stretch 2 reps;30 seconds    Figure 4 Stretch Limitations L      Lumbar Exercises: Aerobic   Nustep L5 x 6 min (UEs/LEs)      Lumbar Exercises: Supine   Bent Knee Raise 10 reps;3 seconds    Bent Knee Raise Limitations brace march    Bridge 10 reps;3 seconds  Straight Leg Raise 3 seconds;5 reps    Straight Leg Raises Limitations 2 x 5 reps   cues for abdom. bracing     Knee/Hip Exercises: Supine   Bridges --      Moist Heat Therapy   Number Minutes Moist Heat 10 Minutes   lumbar and L anterior hip   Moist Heat Location Hip      Manual Therapy   Manual Therapy Soft tissue mobilization    Manual therapy comments mod thomas position    Soft tissue mobilization STM to L vastus lateralis, TFL, rectus flemoris, iliopsoas    Myofascial Release TPR to L mid, prox quad, TFL, iliopsoas                  PT Education - 04/03/20 1751    Education Details HEP update    Person(s) Educated Patient    Methods Explanation;Demonstration;Verbal cues;Handout    Comprehension Verbalized understanding;Returned demonstration;Verbal cues required            PT Short Term Goals - 03/17/20 1616      PT SHORT TERM GOAL #1   Title Patient to be independent with initial HEP.    Time 2    Period Weeks    Status Achieved    Target Date 03/27/20              PT Tamara Morton Term Goals - 03/17/20 1616      PT Tamara Morton TERM GOAL #1   Title Patient to be independent with advanced HEP.    Time 6    Period Weeks    Status On-going      PT Tamara Morton TERM GOAL #2   Title Patient to demonstrate lumbar AROM WFL and without pain limiting.    Time 6    Period Weeks    Status On-going      PT Tamara Morton TERM GOAL #3   Title Patient to demonstrate B LE strength >/=4+/5.    Time 6    Period Weeks    Status On-going      PT Tamara Morton TERM GOAL #4   Title Patient to report centralization of L thigh pain to LB.    Time 6    Period Weeks    Status On-going      PT Wisz TERM GOAL #5   Title Patient to report tolerance of walking/standing without pain limiting.    Time 6    Period Weeks    Status On-going                 Plan - 04/03/20 1750    Clinical Impression Statement Tamara Morton reporting improved pain levels over this past week with bending movements less irritable now.  Progressed MT to L hip flexors to address ongoing tissue restriction/tightness/tenderness with good improvement.  Therex with hip flexor stretching and strengthening to pt. tolerance along with general hip strengthening.  Ended visit with moist heat to anterior hip and lumbar spine with good relief noted.    Comorbidities HTN, R foot surgery    Rehab Potential Good    PT Frequency 2x / week    PT Duration 6 weeks    PT Treatment/Interventions ADLs/Self Care Home Management;Cryotherapy;Electrical Stimulation;Iontophoresis 4mg /ml Dexamethasone;Moist Heat;Traction;Balance training;Therapeutic exercise;Therapeutic activities;Functional mobility training;Stair training;Gait training;Ultrasound;Neuromuscular re-education;Patient/family education;Manual techniques;Taping;Energy conservation;Dry needling;Passive range of motion    PT Next Visit Plan L lateral hip stretching and strengthening    Consulted and Agree with Plan of Care Patient  Patient will benefit from skilled  therapeutic intervention in order to improve the following deficits and impairments:  Decreased activity tolerance,Decreased strength,Increased fascial restricitons,Pain,Difficulty walking,Increased muscle spasms,Improper body mechanics,Decreased range of motion,Impaired flexibility,Postural dysfunction  Visit Diagnosis: Acute left-sided low back pain with left-sided sciatica  Pain in left hip  Difficulty in walking, not elsewhere classified  Other symptoms and signs involving the musculoskeletal system     Problem List Patient Active Problem List   Diagnosis Date Noted  . COVID-19 virus infection 04/20/2019  . Genetic testing 08/26/2017  . Family history of colon cancer   . Family history of stomach cancer   . Family history of prostate cancer   . Family history of ovarian cancer   . Family history of breast cancer   . Preventative health care 06/20/2016  . Lateral epicondylitis of right elbow 11/26/2015  . Knee LCL sprain 11/26/2015  . HTN (hypertension) 11/11/2015    Kermit Balo, PTA 04/03/20 5:56 PM   [facility] [address]  [address] [place], [place], [address] Phone: [phone]   Fax:  [phone]  Name: Tamara Morton MRN: [id number] Date of Birth: June 30, 1965

## 2020-04-07 ENCOUNTER — Other Ambulatory Visit: Payer: Self-pay

## 2020-04-07 ENCOUNTER — Ambulatory Visit: Payer: Managed Care, Other (non HMO)

## 2020-04-07 DIAGNOSIS — R262 Difficulty in walking, not elsewhere classified: Secondary | ICD-10-CM

## 2020-04-07 DIAGNOSIS — M5442 Lumbago with sciatica, left side: Secondary | ICD-10-CM | POA: Diagnosis not present

## 2020-04-07 DIAGNOSIS — R29898 Other symptoms and signs involving the musculoskeletal system: Secondary | ICD-10-CM

## 2020-04-07 DIAGNOSIS — M25552 Pain in left hip: Secondary | ICD-10-CM

## 2020-04-07 NOTE — Therapy (Signed)
[facility] [address]  [address] [place], [place], [address] Phone: [phone]   Fax:  [phone]  Physical Therapy Treatment  Patient Details  Name: Tamara Morton MRN: [id number] Date of Birth: 1965-12-22 Referring Provider (PT): Lavada Mesi, MD   Encounter Date: 04/07/2020   PT End of Session - 04/07/20 1559    Visit Number 7    Number of Visits 13    Date for PT Re-Evaluation 04/24/20    Authorization Type [employer]    Authorization - Visit Number 7    Authorization - Number of Visits 20    PT Start Time 1537    PT Stop Time 1629    PT Time Calculation (min) 52 min    Activity Tolerance Patient tolerated treatment well    Behavior During Therapy [facility] for tasks assessed/performed           Past Medical History:  Diagnosis Date  . Chicken pox   . Family history of breast cancer   . Family history of colon cancer   . Family history of ovarian cancer   . Family history of prostate cancer   . Family history of stomach cancer   . History of stomach ulcers   . Hypertension     Past Surgical History:  Procedure Laterality Date  . ABDOMINAL HYSTERECTOMY  2009   uterine fibroids  TAH BSO  . FOOT SURGERY Right "About 6-7 yrs ago"    There were no vitals filed for this visit.   Subjective Assessment - 04/07/20 1556    Subjective Pt. noting improvement in pain.    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  Negative for fracture    Patient Stated Goals decrease pain    Currently in Pain? No/denies    Pain Score 0-No pain    Multiple Pain Sites No                             [facility] Adult PT Treatment/Exercise - 04/07/20 0001      Lumbar Exercises: Stretches   Passive Hamstring Stretch Left;30 seconds;2 reps    Passive Hamstring Stretch Limitations supine with strap    Lower Trunk Rotation Limitations 5" x 10     Hip Flexor Stretch Left;2 reps;30 seconds    Piriformis Stretch Left;2 reps;Right;30 seconds    Piriformis Stretch Limitations sitting KTOS    Figure 4 Stretch 2 reps;30 seconds    Figure 4 Stretch Limitations L      Lumbar Exercises: Aerobic   Nustep L5 x 6 min (UEs/LEs)      Knee/Hip Exercises: Standing   Forward Step Up Left;10 reps;Step Height: 6";Hand Hold: 1    Forward Step Up Limitations cues for L LE effort      Knee/Hip Exercises: Supine   Bridges Both;10 reps;Strengthening    Bridges Limitations cues for increased      Moist Heat Therapy   Number Minutes Moist Heat 10 Minutes    Moist Heat Location Hip      Manual Therapy   Manual Therapy Soft tissue mobilization    Manual therapy comments mod thomas position    Soft tissue mobilization STM to L vastus lateralis, TFL, rectus flemoris, iliopsoas    Myofascial Release TPR to L mid, prox quad, TFL, iliopsoas  PT Education - 04/07/20 1802    Education Details HEP update    Person(s) Educated Patient    Methods Explanation;Demonstration;Verbal cues;Handout    Comprehension Verbalized understanding;Returned demonstration;Verbal cues required            PT Short Term Goals - 03/17/20 1616      PT SHORT TERM GOAL #1   Title Patient to be independent with initial HEP.    Time 2    Period Weeks    Status Achieved    Target Date 03/27/20             PT Mcginley Term Goals - 03/17/20 1616      PT Bors TERM GOAL #1   Title Patient to be independent with advanced HEP.    Time 6    Period Weeks    Status On-going      PT Terada TERM GOAL #2   Title Patient to demonstrate lumbar AROM WFL and without pain limiting.    Time 6    Period Weeks    Status On-going      PT Urton TERM GOAL #3   Title Patient to demonstrate B LE strength >/=4+/5.    Time 6    Period Weeks    Status On-going      PT [place] TERM GOAL #4   Title Patient to report centralization of L thigh pain to LB.    Time  6    Period Weeks    Status On-going      PT Hallquist TERM GOAL #5   Title Patient to report tolerance of walking/standing without pain limiting.    Time 6    Period Weeks    Status On-going                 Plan - 04/07/20 1604    Clinical Impression Statement Consuela noting improved pain levels since starting therapy and improving tolerance for hip flexor stretching at home.  Progressed hip flexor strengthening to push toward normalized gait mechanics walking and climbing stairs.  HEP updated.  Pt. continues with tenderness on L anterior hip thus continued MT for improved tissue quality with multiple TPs noted.  Ended visit pain free.    Comorbidities HTN, R foot surgery    Rehab Potential Good    PT Frequency 2x / week    PT Duration 6 weeks    PT Treatment/Interventions ADLs/Self Care Home Management;Cryotherapy;Electrical Stimulation;Iontophoresis 4mg /ml Dexamethasone;Moist Heat;Traction;Balance training;Therapeutic exercise;Therapeutic activities;Functional mobility training;Stair training;Gait training;Ultrasound;Neuromuscular re-education;Patient/family education;Manual techniques;Taping;Energy conservation;Dry needling;Passive range of motion    PT Next Visit Plan L lateral hip stretching and strengthening    Consulted and Agree with Plan of Care Patient           Patient will benefit from skilled therapeutic intervention in order to improve the following deficits and impairments:  Decreased activity tolerance,Decreased strength,Increased fascial restricitons,Pain,Difficulty walking,Increased muscle spasms,Improper body mechanics,Decreased range of motion,Impaired flexibility,Postural dysfunction  Visit Diagnosis: Acute left-sided low back pain with left-sided sciatica  Pain in left hip  Difficulty in walking, not elsewhere classified  Other symptoms and signs involving the musculoskeletal system     Problem List Patient Active Problem List   Diagnosis Date Noted   . COVID-19 virus infection 04/20/2019  . Genetic testing 08/26/2017  . Family history of colon cancer   . Family history of stomach cancer   . Family history of prostate cancer   . Family history of ovarian cancer   . Family history  of breast cancer   . Preventative health care 06/20/2016  . Lateral epicondylitis of right elbow 11/26/2015  . Knee LCL sprain 11/26/2015  . HTN (hypertension) 11/11/2015    Kermit Balo, PTA 04/07/20 6:05 PM   [facility] [address]  [address] [place], [place], [address] Phone: [phone]   Fax:  [phone]  Name: SHERRIAN NUNNELLEY MRN: [id number] Date of Birth: [birth year]

## 2020-04-09 ENCOUNTER — Other Ambulatory Visit: Payer: Self-pay

## 2020-04-09 ENCOUNTER — Ambulatory Visit: Payer: Managed Care, Other (non HMO)

## 2020-04-09 DIAGNOSIS — R262 Difficulty in walking, not elsewhere classified: Secondary | ICD-10-CM

## 2020-04-09 DIAGNOSIS — M5442 Lumbago with sciatica, left side: Secondary | ICD-10-CM | POA: Diagnosis not present

## 2020-04-09 DIAGNOSIS — M25552 Pain in left hip: Secondary | ICD-10-CM

## 2020-04-09 DIAGNOSIS — R29898 Other symptoms and signs involving the musculoskeletal system: Secondary | ICD-10-CM

## 2020-04-09 NOTE — Therapy (Signed)
[facility] [address]  [place], [place], [address] Phone: [phone]   Fax:  [phone]  Physical Therapy Treatment  Patient Details  Name: Tamara Morton MRN: [id number] Date of Birth: 08-12-1965 Referring Provider (PT): Eunice Blase, MD   Encounter Date: 04/09/2020   PT End of Session - 04/09/20 1540    Visit Number 8    Number of Visits 13    Date for PT Re-Evaluation 04/24/20    Authorization Type [employer]    Authorization - Visit Number 8    Authorization - Number of Visits 20    PT Start Time 7616    PT Stop Time 1625    PT Time Calculation (min) 54 min    Activity Tolerance Patient tolerated treatment well    Behavior During Therapy [facility] for tasks assessed/performed           Past Medical History:  Diagnosis Date  . Chicken pox   . Family history of breast cancer   . Family history of colon cancer   . Family history of ovarian cancer   . Family history of prostate cancer   . Family history of stomach cancer   . History of stomach ulcers   . Hypertension     Past Surgical History:  Procedure Laterality Date  . ABDOMINAL HYSTERECTOMY  2009   uterine fibroids  TAH BSO  . FOOT SURGERY Right "About 6-7 yrs ago"    There were no vitals filed for this visit.   Subjective Assessment - 04/09/20 1539    Subjective Pt. noting 90% improvement in thigh pain.    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  Negative for fracture    Patient Stated Goals decrease pain    Currently in Pain? No/denies    Pain Score 0-No pain                             [facility] Adult PT Treatment/Exercise - 04/09/20 0001      Lumbar Exercises: Stretches   Hip Flexor Stretch Left;2 reps;30 seconds      Lumbar Exercises: Aerobic   Recumbent Bike Lvl 1, 7 min      Knee/Hip Exercises: Standing   Forward Step Up  Right;Left;10 reps    Forward Step Up Limitations step-ups to TM    Functional Squat 3 seconds   x 12 reps   Functional Squat Limitations counter      Moist Heat Therapy   Number Minutes Moist Heat 10 Minutes    Moist Heat Location Hip   L anterior hip and lumbar spine     Manual Therapy   Manual Therapy Soft tissue mobilization    Manual therapy comments mod thomas position    Soft tissue mobilization STM to L vastus lateralis, rectus flemoris, iliopsoas    Myofascial Release TPR to L mid, prox quad, iliopsoas                    PT Short Term Goals - 03/17/20 1616      PT SHORT TERM GOAL #1   Title Patient to be independent with initial HEP.    Time 2    Period Weeks    Status Achieved    Target Date 03/27/20             PT  Stagner Term Goals - 04/09/20 1551      PT Mcclarty TERM GOAL #1   Title Patient to be independent with advanced HEP.    Time 6    Period Weeks    Status Partially Met      PT Smucker TERM GOAL #2   Title Patient to demonstrate lumbar AROM WFL and without pain limiting.    Time 6    Period Weeks    Status On-going      PT Rufer TERM GOAL #3   Title Patient to demonstrate B LE strength >/=4+/5.    Time 6    Period Weeks    Status On-going      PT Boberg TERM GOAL #4   Title Patient to report centralization of L thigh pain to LB.    Time 6    Period Weeks    Status Partially Met   04/09/20: L thigh pain resolved 90 %     PT Sumpter TERM GOAL #5   Title Patient to report tolerance of walking/standing without pain limiting.    Time 6    Period Weeks    Status On-going                 Plan - 04/09/20 1552    Clinical Impression Statement Tamara Morton.  Feels her L anterior hip pain has improved by 90 %.  Progressed LE strengthening and continued MT to hip flexors for improved comfort with walking and standing.  Pt. with notable improvement in hip flexor tissue quality with less tenderness.   Comorbidities HTN, R foot surgery     Rehab Potential Good    PT Frequency 2x / week    PT Duration 6 weeks    PT Treatment/Interventions ADLs/Self Care Home Management;Cryotherapy;Electrical Stimulation;Iontophoresis 22m/ml Dexamethasone;Moist Heat;Traction;Balance training;Therapeutic exercise;Therapeutic activities;Functional mobility training;Stair training;Gait training;Ultrasound;Neuromuscular re-education;Patient/family education;Manual techniques;Taping;Energy conservation;Dry needling;Passive range of motion    PT Next Visit Plan L lateral hip stretching and strengthening    Consulted and Agree with Plan of Care Patient           Patient will benefit from skilled therapeutic intervention in order to improve the following deficits and impairments:  Decreased activity tolerance,Decreased strength,Increased fascial restricitons,Pain,Difficulty walking,Increased muscle spasms,Improper body mechanics,Decreased range of motion,Impaired flexibility,Postural dysfunction  Visit Diagnosis: Acute left-sided low back pain with left-sided sciatica  Pain in left hip  Difficulty in walking, not elsewhere classified  Other symptoms and signs involving the musculoskeletal system     Problem List Patient Active Problem List   Diagnosis Date Noted  . COVID-19 virus infection 04/20/2019  . Genetic testing 08/26/2017  . Family history of colon cancer   . Family history of stomach cancer   . Family history of prostate cancer   . Family history of ovarian cancer   . Family history of breast cancer   . Preventative health care 06/20/2016  . Lateral epicondylitis of right elbow 11/26/2015  . Knee LCL sprain 11/26/2015  . HTN (hypertension) 11/11/2015   Tamara Morton PTA 04/09/20 5:58 PM   [facility] [address] [place] [address]Phone: [phone]  Fax:  [phone] Name: Tamara BEAGLEYMRN: [id number] of Birth: 1March 23, 1967

## 2020-04-14 ENCOUNTER — Ambulatory Visit: Payer: Managed Care, Other (non HMO) | Admitting: Physical Therapy

## 2020-04-17 ENCOUNTER — Ambulatory Visit: Payer: Managed Care, Other (non HMO) | Attending: Family Medicine

## 2020-04-17 ENCOUNTER — Other Ambulatory Visit: Payer: Self-pay

## 2020-04-17 DIAGNOSIS — R262 Difficulty in walking, not elsewhere classified: Secondary | ICD-10-CM | POA: Diagnosis present

## 2020-04-17 DIAGNOSIS — M5442 Lumbago with sciatica, left side: Secondary | ICD-10-CM | POA: Diagnosis not present

## 2020-04-17 DIAGNOSIS — M25552 Pain in left hip: Secondary | ICD-10-CM | POA: Diagnosis present

## 2020-04-17 DIAGNOSIS — R29898 Other symptoms and signs involving the musculoskeletal system: Secondary | ICD-10-CM | POA: Insufficient documentation

## 2020-04-17 NOTE — Therapy (Signed)
[facility] [address]  [place], [place], [address] Phone: [phone]   Fax:  [phone]  Physical Therapy Treatment  Patient Details  Name: Tamara Morton MRN: [id number] Date of Birth: [birth year] Referring Provider (PT): Eunice Blase, MD   Encounter Date: 04/17/2020   PT End of Session - 04/17/20 1635    Visit Number 9    Number of Visits 13    Date for PT Re-Evaluation 04/24/20    Authorization Type [employer]    Authorization - Visit Number 9    Authorization - Number of Visits 20    PT Start Time 1628   pt. arrived late to session   PT Stop Time 1706    PT Time Calculation (min) 38 min    Activity Tolerance Patient tolerated treatment well    Behavior During Therapy [facility] for tasks assessed/performed           Past Medical History:  Diagnosis Date  . Chicken pox   . Family history of breast cancer   . Family history of colon cancer   . Family history of ovarian cancer   . Family history of prostate cancer   . Family history of stomach cancer   . History of stomach ulcers   . Hypertension     Past Surgical History:  Procedure Laterality Date  . ABDOMINAL HYSTERECTOMY  2009   uterine fibroids  TAH BSO  . FOOT SURGERY Right "About 6-7 yrs ago"    There were no vitals filed for this visit.   Subjective Assessment - 04/17/20 1639    Subjective Pt. noting "mild" pain now at work.    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  Negative for fracture    Patient Stated Goals decrease pain    Currently in Pain? No/denies    Pain Score 0-No pain   up to a 3-4/10 at times at work   Pain Location Hip    Pain Orientation Left;Anterior;Lateral    Pain Descriptors / Indicators Tightness    Pain Type Acute pain    Pain Onset More than a month ago    Pain Frequency Intermittent    Aggravating Factors  first standing after  prolonged sitting, getting up in mornings    Multiple Pain Sites No                             [facility] Adult PT Treatment/Exercise - 04/17/20 0001      Lumbar Exercises: Stretches   Hip Flexor Stretch Left;2 reps;30 seconds    Hip Flexor Stretch Limitations seated lunge stretch    Other Lumbar Stretch Exercise L lumbar stretch in childs pose x 30 sec    Other Lumbar Stretch Exercise L QL stretch x 30 sec      Lumbar Exercises: Aerobic   Nustep L5 x 6 min (UEs/LEs)      Knee/Hip Exercises: Standing   Hip Flexion Right;Left;10 reps;Knee bent;Stengthening    Hip Flexion Limitations yellow looped TB at feet    Hip Abduction Right;Left;10 reps;Knee straight;Stengthening    Abduction Limitations red looped TB at ankles    Other Standing Knee Exercises Standing side stepping and monster walking with red looped TB at ankles 4 x 20 ft  PT Short Term Goals - 03/17/20 1616      PT SHORT TERM GOAL #1   Title Patient to be independent with initial HEP.    Time 2    Period Weeks    Status Achieved    Target Date 03/27/20             PT Raimondo Term Goals - 04/09/20 1551      PT Rone TERM GOAL #1   Title Patient to be independent with advanced HEP.    Time 6    Period Weeks    Status Partially Met      PT Craine TERM GOAL #2   Title Patient to demonstrate lumbar AROM WFL and without pain limiting.    Time 6    Period Weeks    Status On-going      PT Lentz TERM GOAL #3   Title Patient to demonstrate B LE strength >/=4+/5.    Time 6    Period Weeks    Status On-going      PT Reiger TERM GOAL #4   Title Patient to report centralization of L thigh pain to LB.    Time 6    Period Weeks    Status Partially Met   04/09/20: L thigh pain resolved 90 %     PT Wyman TERM GOAL #5   Title Patient to report tolerance of walking/standing without pain limiting.    Time 6    Period Weeks    Status On-going                 Plan -  04/17/20 1641    Clinical Impression Statement Pt. arrived late to the session thus tx time limited.  Notes pain this past week at work only "mild" at times of standing after prolonged sitting.  Able to progress hip flexor and LE strengthening activities today without increased pain and Bayley noting much improved tolerance for HEP now.  Ended visit without pain.    Comorbidities HTN, R foot surgery    Rehab Potential Good    PT Frequency 2x / week    PT Duration 6 weeks    PT Treatment/Interventions ADLs/Self Care Home Management;Cryotherapy;Electrical Stimulation;Iontophoresis 1m/ml Dexamethasone;Moist Heat;Traction;Balance training;Therapeutic exercise;Therapeutic activities;Functional mobility training;Stair training;Gait training;Ultrasound;Neuromuscular re-education;Patient/family education;Manual techniques;Taping;Energy conservation;Dry needling;Passive range of motion    PT Next Visit Plan L lateral hip stretching and strengthening    Consulted and Agree with Plan of Care Patient           Patient will benefit from skilled therapeutic intervention in order to improve the following deficits and impairments:  Decreased activity tolerance,Decreased strength,Increased fascial restricitons,Pain,Difficulty walking,Increased muscle spasms,Improper body mechanics,Decreased range of motion,Impaired flexibility,Postural dysfunction  Visit Diagnosis: Acute left-sided low back pain with left-sided sciatica  Pain in left hip  Difficulty in walking, not elsewhere classified  Other symptoms and signs involving the musculoskeletal system     Problem List Patient Active Problem List   Diagnosis Date Noted  . COVID-19 virus infection 04/20/2019  . Genetic testing 08/26/2017  . Family history of colon cancer   . Family history of stomach cancer   . Family history of prostate cancer   . Family history of ovarian cancer   . Family history of breast cancer   . Preventative health care  06/20/2016  . Lateral epicondylitis of right elbow 11/26/2015  . Knee LCL sprain 11/26/2015  . HTN (hypertension) 11/11/2015    MBess Harvest PTA 04/17/20 6:08 PM  [facility] [address]  [place], [place], [address] Phone: [phone]   Fax:  [phone]  Name: Tamara Morton MRN: [id number] Date of Birth: 07/05/1965

## 2020-04-18 ENCOUNTER — Telehealth: Payer: Self-pay | Admitting: Family Medicine

## 2020-04-18 DIAGNOSIS — I10 Essential (primary) hypertension: Secondary | ICD-10-CM

## 2020-04-18 MED ORDER — SPIRONOLACTONE 25 MG PO TABS: 25.0000 mg | ORAL_TABLET | Freq: Every day | ORAL | 1 refills | Status: DC

## 2020-04-18 MED ORDER — DILTIAZEM HCL ER COATED BEADS 300 MG PO CP24: 300.0000 mg | ORAL_CAPSULE | Freq: Every day | ORAL | 1 refills | Status: DC

## 2020-04-18 NOTE — Telephone Encounter (Signed)
Medication:diltiazem (CARDIZEM CD) 300 MG 24 hr capsule [[id number]]   spironolactone (ALDACTONE) 25 MG tablet [[id number]]      Has the patient contacted their pharmacy? NO (If no, request that the patient contact the pharmacy for the refill.) (If yes, when and what did the pharmacy advise?)    Preferred Pharmacy (with phone number or street name):  [facility] [place] [facility]  [place], [place] [address]  Phone:  [phone] Fax:  [phone]     Agent: Please be advised that RX refills may take up to 3 business days. We ask that you follow-up with your pharmacy.

## 2020-04-18 NOTE — Telephone Encounter (Signed)
Rxs sent

## 2020-04-21 ENCOUNTER — Ambulatory Visit: Payer: Managed Care, Other (non HMO)

## 2020-04-21 ENCOUNTER — Other Ambulatory Visit: Payer: Self-pay

## 2020-04-21 DIAGNOSIS — R29898 Other symptoms and signs involving the musculoskeletal system: Secondary | ICD-10-CM

## 2020-04-21 DIAGNOSIS — M5442 Lumbago with sciatica, left side: Secondary | ICD-10-CM | POA: Diagnosis not present

## 2020-04-21 DIAGNOSIS — R262 Difficulty in walking, not elsewhere classified: Secondary | ICD-10-CM

## 2020-04-21 DIAGNOSIS — M25552 Pain in left hip: Secondary | ICD-10-CM

## 2020-04-21 NOTE — Therapy (Addendum)
[facility] [address]  [place], [place], [address] Phone: [phone]   Fax:  [phone]  Physical Therapy Treatment  Patient Details  Name: Tamara Morton MRN: [id number] Date of Birth: Nov 30, 1965 Referring Provider (PT): Eunice Blase, MD   Encounter Date: 04/21/2020   PT End of Session - 04/21/20 1537    Visit Number 10    Number of Visits 13    Date for PT Re-Evaluation 04/24/20    Authorization Type [employer]    Authorization - Visit Number 10    Authorization - Number of Visits 20    PT Start Time 1532    PT Stop Time 1617    PT Time Calculation (min) 45 min    Activity Tolerance Patient tolerated treatment well    Behavior During Therapy [facility] for tasks assessed/performed           Past Medical History:  Diagnosis Date  . Chicken pox   . Family history of breast cancer   . Family history of colon cancer   . Family history of ovarian cancer   . Family history of prostate cancer   . Family history of stomach cancer   . History of stomach ulcers   . Hypertension     Past Surgical History:  Procedure Laterality Date  . ABDOMINAL HYSTERECTOMY  2009   uterine fibroids  TAH BSO  . FOOT SURGERY Right "About 6-7 yrs ago"    There were no vitals filed for this visit.   Subjective Assessment - 04/21/20 1535    Subjective Pt. noting 95% improvement in thigh pain since starting therapy.    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  Negative for fracture    Patient Stated Goals decrease pain    Currently in Pain? No/denies    Pain Score 0-No pain    Pain Location Hip    Pain Orientation --    Pain Type --    Pain Onset --    Pain Frequency Intermittent    Multiple Pain Sites No              [facility] PT Assessment - 04/21/20 0001      Observation/Other Assessments   Focus on Therapeutic Outcomes (FOTO)  FOTO:  Physical FS primary measure 65      AROM   AROM Assessment Site Lumbar    Lumbar Flexion distal shin    Lumbar Extension moderately limited    Lumbar - Right Side Bend knee joint line    Lumbar - Left Side Bend knee joint line    Lumbar - Right Rotation WFL    Lumbar - Left Rotation [facility]      Strength   Strength Assessment Site Hip;Knee;Ankle    Right/Left Hip Right;Left    Right Hip Flexion 4+/5    Right Hip Extension 4/5    Right Hip ABduction 4+/5    Left Hip Flexion 4/5    Left Hip Extension 4/5    Left Hip ABduction 4/5    Left Hip ADduction 4+/5    Right/Left Knee Right;Left    Right Knee Flexion 5/5    Right Knee Extension 5/5    Left Knee Flexion 4+/5    Left Knee Extension 4+/5    Right/Left Ankle Right;Left    Right Ankle Dorsiflexion 4+/5    Right Ankle Plantar Flexion 4+/5  Left Ankle Dorsiflexion 4+/5    Left Ankle Plantar Flexion 4+/5                         [facility] Adult PT Treatment/Exercise - 04/21/20 0001      Lumbar Exercises: Aerobic   Nustep L5 x 6 min (UEs/LEs)      Lumbar Exercises: Supine   Dead Bug 5 reps;3 seconds    Dead Bug Limitations Pt. with difficulty controlling motion however good carryover with cueing      Knee/Hip Exercises: Sidelying   Hip ABduction Right;Left;2 sets;10 reps;Strengthening    Hip ABduction Limitations cues for alignment                  PT Education - 04/21/20 1701    Education Details HEP update    Person(s) Educated Patient    Methods Explanation;Demonstration;Verbal cues;Handout    Comprehension Verbalized understanding;Returned demonstration;Verbal cues required            PT Short Term Goals - 03/17/20 1616      PT SHORT TERM GOAL #1   Title Patient to be independent with initial HEP.    Time 2    Period Weeks    Status Achieved    Target Date 03/27/20             PT Martinovich Term Goals - 04/21/20 1556      PT Resnik TERM GOAL #1   Title Patient to be independent with  advanced HEP.    Time 6    Period Weeks    Status Partially Met      PT Trageser TERM GOAL #2   Title Patient to demonstrate lumbar AROM WFL and without pain limiting.    Time 6    Period Weeks    Status Partially Met      PT Quintanar TERM GOAL #3   Title Patient to demonstrate B LE strength >/=4+/5.    Time 6    Period Weeks    Status Partially Met      PT Stengel TERM GOAL #4   Title Patient to report centralization of L thigh pain to LB.    Time 6    Period Weeks    Status Partially Met   04/21/20:  L thigh pain resolved 90-95 %     PT Gadson TERM GOAL #5   Title Patient to report tolerance of walking/standing without pain limiting.    Time 6    Period Weeks    Status Partially Met   04/22/19: Pt. noting 35-40 min of walking before being limited by pain.  started with 5 min tolerance to start therapy session.                Plan - 04/21/20 1539    Clinical Impression Statement Pt. noting good improvement in pain and use of L LE since starting therapy.  Reports daily adherence to "most" HEP activities.  Able to demonstrate improved tolerance for lumbar AROM extension and flexion today partially achieving LTG #2.  Demonstrating improving B LE strength most improved in L knee flexion, extension, hip flexion strength with MMT.  LTG #3 partially achieved.  Pt. noting 90% centralization of thigh pain to lumbar spine since starting therapy.  LTG #4 partially achieved.  LTG #5 partially achieved as pt. noting improvement in walking standing tolerance from 5 min at initial evaluation to 35-40 min before needing to sit due to pain.  Pt. will continue to benefit from further skilled therapy to maximize functional strength and mobility.    Comorbidities HTN, R foot surgery    Rehab Potential Good    PT Frequency 2x / week    PT Duration 6 weeks    PT Treatment/Interventions ADLs/Self Care Home Management;Cryotherapy;Electrical Stimulation;Iontophoresis 16m/ml Dexamethasone;Moist  Heat;Traction;Balance training;Therapeutic exercise;Therapeutic activities;Functional mobility training;Stair training;Gait training;Ultrasound;Neuromuscular re-education;Patient/family education;Manual techniques;Taping;Energy conservation;Dry needling;Passive range of motion    PT Next Visit Plan L lateral hip stretching and strengthening    Consulted and Agree with Plan of Care Patient           Patient will benefit from skilled therapeutic intervention in order to improve the following deficits and impairments:  Decreased activity tolerance,Decreased strength,Increased fascial restricitons,Pain,Difficulty walking,Increased muscle spasms,Improper body mechanics,Decreased range of motion,Impaired flexibility,Postural dysfunction  Visit Diagnosis: Acute left-sided low back pain with left-sided sciatica  Pain in left hip  Difficulty in walking, not elsewhere classified  Other symptoms and signs involving the musculoskeletal system     Problem List Patient Active Problem List   Diagnosis Date Noted  . COVID-19 virus infection 04/20/2019  . Genetic testing 08/26/2017  . Family history of colon cancer   . Family history of stomach cancer   . Family history of prostate cancer   . Family history of ovarian cancer   . Family history of breast cancer   . Preventative health care 06/20/2016  . Lateral epicondylitis of right elbow 11/26/2015  . Knee LCL sprain 11/26/2015  . HTN (hypertension) 11/11/2015    MBess Harvest PTA 04/21/20 5:03 PM   [facility] [address] [place] [address]Phone: [phone]  Fax:  [phone] Name: CCANDAS DEEMERMRN: [id number] of Birth: [birth year]

## 2020-04-24 ENCOUNTER — Encounter: Payer: Self-pay | Admitting: Physical Therapy

## 2020-04-24 ENCOUNTER — Other Ambulatory Visit: Payer: Self-pay

## 2020-04-24 ENCOUNTER — Ambulatory Visit: Payer: Managed Care, Other (non HMO) | Admitting: Physical Therapy

## 2020-04-24 DIAGNOSIS — M5442 Lumbago with sciatica, left side: Secondary | ICD-10-CM

## 2020-04-24 DIAGNOSIS — M25552 Pain in left hip: Secondary | ICD-10-CM

## 2020-04-24 DIAGNOSIS — R29898 Other symptoms and signs involving the musculoskeletal system: Secondary | ICD-10-CM

## 2020-04-24 DIAGNOSIS — R262 Difficulty in walking, not elsewhere classified: Secondary | ICD-10-CM

## 2020-04-24 NOTE — Therapy (Addendum)
[facility] [address]  [place], [place], [address] Phone: [phone]   Fax:  [phone]  Physical Therapy Treatment  Patient Details  Name: Tamara Morton MRN: [id number] Date of Birth: Apr 02, 1966 Referring Provider (PT): Eunice Blase, MD   Encounter Date: 04/24/2020   PT End of Session - 04/24/20 1651    Visit Number 11    Number of Visits 13    Date for PT Re-Evaluation 04/24/20    Authorization Type [employer]    Authorization - Visit Number 11    Authorization - Number of Visits 20    PT Start Time 2536    PT Stop Time 1648    PT Time Calculation (min) 29 min    Activity Tolerance Patient tolerated treatment well    Behavior During Therapy [facility] for tasks assessed/performed           Past Medical History:  Diagnosis Date  . Chicken pox   . Family history of breast cancer   . Family history of colon cancer   . Family history of ovarian cancer   . Family history of prostate cancer   . Family history of stomach cancer   . History of stomach ulcers   . Hypertension     Past Surgical History:  Procedure Laterality Date  . ABDOMINAL HYSTERECTOMY  2009   uterine fibroids  TAH BSO  . FOOT SURGERY Right "About 6-7 yrs ago"    There were no vitals filed for this visit.   Subjective Assessment - 04/24/20 1620    Subjective Reports that she feels that she would like to work on things on her own from now on.    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  Negative for fracture    Patient Stated Goals decrease pain    Currently in Pain? No/denies              [facility] PT Assessment - 04/24/20 0001      Assessment   Medical Diagnosis Pain of L thigh    Referring Provider (PT) Eunice Blase, MD    Onset Date/Surgical Date 01/12/20      Observation/Other Assessments   Focus on Therapeutic Outcomes (FOTO)  FOTO: Physical FS  primary measure 65      AROM   Overall AROM Comments objective measurements carried over from 04/21/20    Lumbar Flexion distal shin    Lumbar Extension moderately limited    Lumbar - Right Side Bend knee joint line    Lumbar - Left Side Bend knee joint line    Lumbar - Right Rotation [facility]    Lumbar - Left Rotation [facility]      Strength   Right Hip Flexion 4+/5    Right Hip Extension 4/5    Right Hip ABduction 4+/5    Left Hip Flexion 4/5    Left Hip Extension 4/5    Left Hip ABduction 4/5    Left Hip ADduction 4+/5    Right Knee Flexion 5/5    Right Knee Extension 5/5    Left Knee Flexion 4+/5    Left Knee Extension 4+/5    Right Ankle Dorsiflexion 4+/5    Right Ankle Plantar Flexion 4+/5    Left Ankle Dorsiflexion 4+/5    Left Ankle Plantar Flexion 4+/5  [facility] Adult PT Treatment/Exercise - 04/24/20 0001      Lumbar Exercises: Aerobic   Nustep L5 x 6 min (LEs)      Lumbar Exercises: Standing   Other Standing Lumbar Exercises thoracolumbar extension at wall 10x to tolerance   cueing for form     Lumbar Exercises: Supine   Bridge with clamshell 10 reps   red TB above knees; limited ROM   Bridge with March 5 reps    Bridge with [employer] Limitations difficulty, patricularly with L LE stabilzing      Lumbar Exercises: Prone   Other Prone Lumbar Exercises partial press up on hand 10x3" to tolerance   difficulty d/t UE fatigue; manual cues to maintain hips on mat     Knee/Hip Exercises: Standing   Hip Flexion Stengthening;Both;1 set;10 reps    Hip Flexion Limitations resisted march with yellow TB with UE support on chair      Knee/Hip Exercises: Sidelying   Hip ABduction Strengthening;Left;1 set;10 reps    Hip ABduction Limitations improved alignment                  PT Education - 04/24/20 1651    Education Details update/consolidation of HEP    Person(s) Educated Patient    Methods Explanation;Demonstration;Tactile  cues;Verbal cues;Handout    Comprehension Returned demonstration;Verbalized understanding            PT Short Term Goals - 03/17/20 1616      PT SHORT TERM GOAL #1   Title Patient to be independent with initial HEP.    Time 2    Period Weeks    Status Achieved    Target Date 03/27/20             PT Kerper Term Goals - 04/24/20 1652      PT Meeker TERM GOAL #1   Title Patient to be independent with advanced HEP.    Time 6    Period Weeks    Status Achieved      PT Varnadore TERM GOAL #2   Title Patient to demonstrate lumbar AROM WFL and without pain limiting.    Time 6    Period Weeks    Status Partially Met      PT Swatzell TERM GOAL #3   Title Patient to demonstrate B LE strength >/=4+/5.    Time 6    Period Weeks    Status Partially Met      PT Eshelman TERM GOAL #4   Title Patient to report centralization of L thigh pain to LB.    Time 6    Period Weeks    Status Partially Met   04/21/20:  L thigh pain resolved 90-95 %     PT Gonnella TERM GOAL #5   Title Patient to report tolerance of walking/standing without pain limiting.    Time 6    Period Weeks    Status Partially Met   04/22/19: Pt. noting 35-40 min of walking before being limited by pain.  started with 5 min tolerance to start therapy session.                Plan - 04/24/20 1652    Clinical Impression Statement Patient requesting to transition to HEP at this time d/t progress with therapy. Worked on addressing remaining strength and ROM deficits seen last session. Patient demonstrated difficulty achieving lumbar extension with mobility work without compensations. Offered verbal and manual cues for proper foam- patient reported best  stretch with prone on hands/elbows. Worked on progressive hip strengthening with patient demonstrate L hip instability. Decreased challenge, with better success. Updated and consolidated HEP with exercises that were well-tolerated today- patient reported understanding and without  complaints at end of session. Patient has demonstrated good progress and is nearly pain-free. Placing patient on 3- day hold at this time with transition to HEP.    Comorbidities HTN, R foot surgery    Rehab Potential Good    PT Frequency 2x / week    PT Duration 6 weeks    PT Treatment/Interventions ADLs/Self Care Home Management;Cryotherapy;Electrical Stimulation;Iontophoresis 42m/ml Dexamethasone;Moist Heat;Traction;Balance training;Therapeutic exercise;Therapeutic activities;Functional mobility training;Stair training;Gait training;Ultrasound;Neuromuscular re-education;Patient/family education;Manual techniques;Taping;Energy conservation;Dry needling;Passive range of motion    PT Next Visit Plan 30 day hold at this time    Consulted and Agree with Plan of Care Patient           Patient will benefit from skilled therapeutic intervention in order to improve the following deficits and impairments:  Decreased activity tolerance,Decreased strength,Increased fascial restricitons,Pain,Difficulty walking,Increased muscle spasms,Improper body mechanics,Decreased range of motion,Impaired flexibility,Postural dysfunction  Visit Diagnosis: Acute left-sided low back pain with left-sided sciatica  Pain in left hip  Difficulty in walking, not elsewhere classified  Other symptoms and signs involving the musculoskeletal system     Problem List Patient Active Problem List   Diagnosis Date Noted  . COVID-19 virus infection 04/20/2019  . Genetic testing 08/26/2017  . Family history of colon cancer   . Family history of stomach cancer   . Family history of prostate cancer   . Family history of ovarian cancer   . Family history of breast cancer   . Preventative health care 06/20/2016  . Lateral epicondylitis of right elbow 11/26/2015  . Knee LCL sprain 11/26/2015  . HTN (hypertension) 11/11/2015     YJanene Harvey PT, DPT 04/24/20 4:54 PM   [facility] [address] [address]Phone: [phone]  Fax:  [phone] Name: Tamara WESTBAYMRN: [id number] of Birth: 105-31-1967  PHYSICAL THERAPY DISCHARGE SUMMARY  Visits from Start of Care: 11  Current functional level related to goals / functional outcomes: See above clinical impression; patient did not return during 30 day hold   Remaining deficits: Decreased lumbar AROM, hip strength, decreased standing/walking tolerance   Education / Equipment: HEP  Plan: Patient agrees to discharge.  Patient goals were partially met. Patient is being discharged due to being pleased with the current functional level.  ?????     YJanene Harvey PT, DPT 05/29/20 1:45 PM

## 2020-09-09 ENCOUNTER — Other Ambulatory Visit: Payer: Self-pay | Admitting: Family Medicine

## 2020-09-09 DIAGNOSIS — I10 Essential (primary) hypertension: Secondary | ICD-10-CM

## 2020-11-20 ENCOUNTER — Other Ambulatory Visit: Payer: Self-pay | Admitting: Family Medicine

## 2020-11-20 DIAGNOSIS — I10 Essential (primary) hypertension: Secondary | ICD-10-CM

## 2021-04-23 ENCOUNTER — Other Ambulatory Visit: Payer: Self-pay | Admitting: Family Medicine

## 2021-04-23 DIAGNOSIS — I10 Essential (primary) hypertension: Secondary | ICD-10-CM

## 2021-04-28 ENCOUNTER — Other Ambulatory Visit: Payer: Self-pay | Admitting: Family Medicine

## 2021-04-28 DIAGNOSIS — I10 Essential (primary) hypertension: Secondary | ICD-10-CM

## 2021-05-01 ENCOUNTER — Ambulatory Visit (INDEPENDENT_AMBULATORY_CARE_PROVIDER_SITE_OTHER): Payer: Managed Care, Other (non HMO) | Admitting: Family Medicine

## 2021-05-01 ENCOUNTER — Encounter: Payer: Self-pay | Admitting: Family Medicine

## 2021-05-01 VITALS — BP 114/70 | HR 68 | Temp 98.5°F | Resp 18 | Ht 67.0 in | Wt 221.8 lb

## 2021-05-01 DIAGNOSIS — Z1159 Encounter for screening for other viral diseases: Secondary | ICD-10-CM | POA: Diagnosis not present

## 2021-05-01 DIAGNOSIS — Z23 Encounter for immunization: Secondary | ICD-10-CM

## 2021-05-01 DIAGNOSIS — I10 Essential (primary) hypertension: Secondary | ICD-10-CM

## 2021-05-01 MED ORDER — DILTIAZEM HCL ER COATED BEADS 300 MG PO CP24: 300.0000 mg | ORAL_CAPSULE | Freq: Every day | ORAL | 1 refills | Status: DC

## 2021-05-01 MED ORDER — SPIRONOLACTONE 25 MG PO TABS: 25.0000 mg | ORAL_TABLET | Freq: Every day | ORAL | 1 refills | Status: DC

## 2021-05-01 NOTE — Patient Instructions (Signed)

## 2021-05-01 NOTE — Progress Notes (Signed)
Subjective:   By signing my name below, I, Tamara Morton, attest that this documentation has been prepared under the direction and in the presence of Tamara Held, DO. 05/01/2021      Patient ID: Tamara Morton, female    DOB: 09/28/1965, 56 y.o.   MRN: [id number]  Chief Complaint  Patient presents with   Hypertension   Follow-up    HPI Patient is in today for an office visit to f/u on bp.  No complaints   Her blood pressure is stable at today's visit. BP Readings from Last 3 Encounters:  05/01/21 114/70  01/08/20 130/80  12/29/19 116/78    She is requesting for a refill on 25 mg spironolactone and 300 mg diltiazem.   She will receive the flu vaccine today. She has 2 Covid-19 vaccines at this time.   Past Medical History:  Diagnosis Date   Chicken pox    Family history of breast cancer    Family history of colon cancer    Family history of ovarian cancer    Family history of prostate cancer    Family history of stomach cancer    History of stomach ulcers    Hypertension     Past Surgical History:  Procedure Laterality Date   ABDOMINAL HYSTERECTOMY  2009   uterine fibroids  TAH BSO   FOOT SURGERY Right "About 6-7 yrs ago"    Family History  Problem Relation Age of Onset   Hypertension Mother    Gout Mother    Glaucoma Mother    Hypertension Father    Heart disease Father    Hematuria Father    Stomach cancer Father 68   Prostate cancer Maternal Uncle 61       metastatic   Colon cancer Maternal Aunt 65   Diabetes Maternal Grandmother    Diabetes Maternal Aunt    Ovarian cancer Maternal Aunt 62   Breast cancer Maternal Aunt 68   Lung cancer Maternal Aunt    Esophageal cancer Neg Hx    Rectal cancer Neg Hx     Social History   Socioeconomic History   Marital status: Single    Spouse name: Not on file   Number of children: Not on file   Years of education: Not on file   Highest education level: Not on file  Occupational History    Occupation: Designer, industrial/product    Comment: [employer]  Tobacco Use   Smoking status: Never   Smokeless tobacco: Never  Substance and Sexual Activity   Alcohol use: Yes    Alcohol/week: 0.0 standard drinks    Comment: rare   Drug use: No   Sexual activity: Never  Other Topics Concern   Not on file  Social History Narrative   exericse-- no   Social Determinants of Health   Financial Resource Strain: Not on file  Food Insecurity: Not on file  Transportation Needs: Not on file  Physical Activity: Not on file  Stress: Not on file  Social Connections: Not on file  Intimate Partner Violence: Not on file    Outpatient Medications Prior to Visit  Medication Sig Dispense Refill   Cholecalciferol (VITAMIN D-3) 125 MCG (5000 UT) TABS Take 1 tablet by mouth daily. 90 tablet 3   diltiazem (CARDIZEM CD) 300 MG 24 hr capsule TAKE 1 CAPSULE DAILY 90 capsule 0   spironolactone (ALDACTONE) 25 MG tablet TAKE 1 TABLET DAILY 90 tablet 1   celecoxib (CELEBREX) 200 MG capsule  Take 1 capsule (200 mg total) by mouth 2 (two) times daily as needed. (Patient not taking: Reported on 05/01/2021) 60 capsule 6   ibuprofen (ADVIL) 200 MG tablet Take 800 mg by mouth every 4 (four) hours as needed. (Patient not taking: Reported on 05/01/2021)     methocarbamol (ROBAXIN) 500 MG tablet Take 1 tablet (500 mg total) by mouth every 6 (six) hours as needed for muscle spasms. (Patient not taking: Reported on 02/22/2020) 45 tablet 1   tiZANidine (ZANAFLEX) 2 MG tablet Take 1-2 tablets (2-4 mg total) by mouth every 6 (six) hours as needed for muscle spasms. (Patient not taking: Reported on 05/01/2021) 60 tablet 1   No facility-administered medications prior to visit.    No Known Allergies  Review of Systems  Constitutional:  Negative for fever.  HENT:  Negative for congestion, ear pain, hearing loss, sinus pain and sore throat.   Eyes:  Negative for blurred vision and pain.  Respiratory:  Negative for cough,  sputum production, shortness of breath and wheezing.   Cardiovascular:  Negative for chest pain and palpitations.  Gastrointestinal:  Negative for blood in stool, constipation, diarrhea, nausea and vomiting.  Genitourinary:  Negative for dysuria, frequency, hematuria and urgency.  Musculoskeletal:  Negative for back pain, falls and myalgias.  Neurological:  Negative for dizziness, sensory change, loss of consciousness, weakness and headaches.  Endo/Heme/Allergies:  Negative for environmental allergies. Does not bruise/bleed easily.  Psychiatric/Behavioral:  Negative for depression and suicidal ideas. The patient is not nervous/anxious and does not have insomnia.       Objective:    Physical Exam Constitutional:      General: She is not in acute distress.    Appearance: Normal appearance. She is not ill-appearing.  HENT:     Head: Normocephalic and atraumatic.     Right Ear: External ear normal.     Left Ear: External ear normal.  Eyes:     Extraocular Movements: Extraocular movements intact.     Pupils: Pupils are equal, round, and reactive to light.  Cardiovascular:     Rate and Rhythm: Normal rate and regular rhythm.     Pulses: Normal pulses.     Heart sounds: Normal heart sounds. No murmur heard.   No gallop.  Pulmonary:     Effort: Pulmonary effort is normal. No respiratory distress.     Breath sounds: Normal breath sounds. No wheezing, rhonchi or rales.  Abdominal:     General: Bowel sounds are normal. There is no distension.     Palpations: Abdomen is soft. There is no mass.     Tenderness: There is no abdominal tenderness. There is no guarding or rebound.     Hernia: No hernia is present.  Musculoskeletal:     Cervical back: Normal range of motion and neck supple.  Lymphadenopathy:     Cervical: No cervical adenopathy.  Skin:    General: Skin is warm and dry.  Neurological:     Mental Status: She is alert and oriented to person, place, and time.  Psychiatric:         Behavior: Behavior normal.    BP 114/70 (BP Location: Right Arm, Patient Position: Sitting, Cuff Size: Large)    Pulse 68    Temp 98.5 F (36.9 C) (Oral)    Resp 18    Ht 5\' 7"  (1.702 m)    Wt 221 lb 12.8 oz (100.6 kg)    SpO2 97%    BMI 34.74 kg/m  Wt Readings from Last 3 Encounters:  05/01/21 221 lb 12.8 oz (100.6 kg)  01/08/20 213 lb 12.8 oz (97 kg)  04/20/19 210 lb (95.3 kg)    Diabetic Foot Exam - Simple   No data filed    Lab Results  Component Value Date   WBC 4.9 06/24/2017   HGB 14.0 06/24/2017   HCT 41.6 06/24/2017   PLT 281.0 06/24/2017   GLUCOSE 95 10/03/2018   CHOL 143 10/03/2018   TRIG 61.0 10/03/2018   HDL 51.00 10/03/2018   LDLCALC 80 10/03/2018   ALT 29 10/03/2018   AST 21 10/03/2018   NA 139 10/03/2018   K 3.9 10/03/2018   CL 106 10/03/2018   CREATININE 1.10 10/03/2018   BUN 15 10/03/2018   CO2 25 10/03/2018   TSH 2.52 06/24/2017   MICROALBUR <0.7 05/09/2015    Lab Results  Component Value Date   TSH 2.52 06/24/2017   Lab Results  Component Value Date   WBC 4.9 06/24/2017   HGB 14.0 06/24/2017   HCT 41.6 06/24/2017   MCV 92.1 06/24/2017   PLT 281.0 06/24/2017   Lab Results  Component Value Date   NA 139 10/03/2018   K 3.9 10/03/2018   CO2 25 10/03/2018   GLUCOSE 95 10/03/2018   BUN 15 10/03/2018   CREATININE 1.10 10/03/2018   BILITOT 0.4 10/03/2018   ALKPHOS 85 10/03/2018   AST 21 10/03/2018   ALT 29 10/03/2018   PROT 7.6 10/03/2018   ALBUMIN 4.1 10/03/2018   CALCIUM 9.1 10/03/2018   GFR 62.72 10/03/2018   Lab Results  Component Value Date   CHOL 143 10/03/2018   Lab Results  Component Value Date   HDL 51.00 10/03/2018   Lab Results  Component Value Date   LDLCALC 80 10/03/2018   Lab Results  Component Value Date   TRIG 61.0 10/03/2018   Lab Results  Component Value Date   CHOLHDL 3 10/03/2018   No results found for: HGBA1C     Assessment & Plan:   Problem List Items Addressed This Visit        Unprioritized   HTN (hypertension)    Well controlled, no changes to meds. Encouraged heart healthy diet such as the DASH diet and exercise as tolerated.       Relevant Medications   spironolactone (ALDACTONE) 25 MG tablet   diltiazem (CARDIZEM CD) 300 MG 24 hr capsule   Other Visit Diagnoses     Need for hepatitis C screening test    -  Primary   Relevant Orders   Hepatitis C antibody   Essential hypertension       Relevant Medications   spironolactone (ALDACTONE) 25 MG tablet   diltiazem (CARDIZEM CD) 300 MG 24 hr capsule   Other Relevant Orders   Comprehensive metabolic panel   Lipid panel   Need for influenza vaccination       Relevant Orders   Flu Vaccine QUAD 75mo+IM (Fluarix, Fluzone & Alfiuria Quad PF)       Meds ordered this encounter  Medications   spironolactone (ALDACTONE) 25 MG tablet    Sig: Take 1 tablet (25 mg total) by mouth daily.    Dispense:  90 tablet    Refill:  1   diltiazem (CARDIZEM CD) 300 MG 24 hr capsule    Sig: Take 1 capsule (300 mg total) by mouth daily.    Dispense:  90 capsule    Refill:  1    I,Tamara  Morton,acting as a Education administrator for [employer], DO.,have documented all relevant documentation on the behalf of Tamara Held, DO,as directed by  Tamara Held, DO while in the presence of Tamara Morton, [place], DO., personally preformed the services described in this documentation.  All medical record entries made by the scribe were at my direction and in my presence.  I have reviewed the chart and discharge instructions (if applicable) and agree that the record reflects my personal performance and is accurate and complete. 05/01/2021

## 2021-05-01 NOTE — Assessment & Plan Note (Signed)
Well controlled, no changes to meds. Encouraged heart healthy diet such as the DASH diet and exercise as tolerated.  °

## 2021-05-04 LAB — COMPREHENSIVE METABOLIC PANEL
AG Ratio: 1.2 (calc) (ref 1.0–2.5)
ALT: 18 U/L (ref 6–29)
AST: 16 U/L (ref 10–35)
Albumin: 4.1 g/dL (ref 3.6–5.1)
Alkaline phosphatase (APISO): 89 U/L (ref 37–153)
BUN/Creatinine Ratio: 12 (calc) (ref 6–22)
BUN: 15 mg/dL (ref 7–25)
CO2: 25 mmol/L (ref 20–32)
Calcium: 9.4 mg/dL (ref 8.6–10.4)
Chloride: 107 mmol/L (ref 98–110)
Creat: 1.25 mg/dL — ABNORMAL HIGH (ref 0.50–1.03)
Globulin: 3.4 g/dL (calc) (ref 1.9–3.7)
Glucose, Bld: 79 mg/dL (ref 65–99)
Potassium: 3.8 mmol/L (ref 3.5–5.3)
Sodium: 141 mmol/L (ref 135–146)
Total Bilirubin: 0.4 mg/dL (ref 0.2–1.2)
Total Protein: 7.5 g/dL (ref 6.1–8.1)

## 2021-05-04 LAB — HEPATITIS C ANTIBODY
Hepatitis C Ab: NONREACTIVE
SIGNAL TO CUT-OFF: 0.09 (ref <1.00)

## 2021-05-04 LAB — LIPID PANEL
Cholesterol: 146 mg/dL (ref <200)
HDL: 45 mg/dL — ABNORMAL LOW (ref >=50)
LDL Cholesterol (Calc): 76 mg/dL (calc)
Non-HDL Cholesterol (Calc): 101 mg/dL (calc) (ref <130)
Total CHOL/HDL Ratio: 3.2 (calc) (ref <5.0)
Triglycerides: 150 mg/dL — ABNORMAL HIGH (ref <150)

## 2021-05-06 ENCOUNTER — Other Ambulatory Visit: Payer: Self-pay | Admitting: Family Medicine

## 2021-05-06 DIAGNOSIS — E785 Hyperlipidemia, unspecified: Secondary | ICD-10-CM

## 2021-09-18 ENCOUNTER — Ambulatory Visit (HOSPITAL_COMMUNITY)
Admission: RE | Admit: 2021-09-18 | Discharge: 2021-09-18 | Disposition: A | Discharge status: Home / Self Care | Payer: Managed Care, Other (non HMO) | Source: Ambulatory Visit | Attending: Emergency Medicine | Admitting: Emergency Medicine

## 2021-09-18 ENCOUNTER — Ambulatory Visit
Admission: EM | Admit: 2021-09-18 | Discharge: 2021-09-18 | Disposition: A | Discharge status: Home / Self Care | Payer: Managed Care, Other (non HMO) | Attending: Emergency Medicine | Admitting: Emergency Medicine

## 2021-09-18 ENCOUNTER — Encounter: Payer: Self-pay | Admitting: Emergency Medicine

## 2021-09-18 DIAGNOSIS — M79672 Pain in left foot: Secondary | ICD-10-CM | POA: Diagnosis present

## 2021-09-18 DIAGNOSIS — M25571 Pain in right ankle and joints of right foot: Secondary | ICD-10-CM | POA: Diagnosis not present

## 2021-09-18 DIAGNOSIS — M2141 Flat foot [pes planus] (acquired), right foot: Secondary | ICD-10-CM | POA: Insufficient documentation

## 2021-09-18 DIAGNOSIS — M79671 Pain in right foot: Secondary | ICD-10-CM | POA: Diagnosis present

## 2021-09-18 DIAGNOSIS — M2142 Flat foot [pes planus] (acquired), left foot: Secondary | ICD-10-CM | POA: Insufficient documentation

## 2021-09-18 DIAGNOSIS — M1990 Unspecified osteoarthritis, unspecified site: Secondary | ICD-10-CM | POA: Diagnosis not present

## 2021-09-18 DIAGNOSIS — M25572 Pain in left ankle and joints of left foot: Secondary | ICD-10-CM

## 2021-09-18 MED ORDER — INDOMETHACIN 50 MG PO CAPS: 50.0000 mg | ORAL_CAPSULE | Freq: Three times a day (TID) | ORAL | 0 refills | Status: AC

## 2021-09-18 NOTE — Discharge Instructions (Addendum)
The results of your blood test, a uric acid level to screen for gout and a CBC to screen for possible infection, should be available in the next 24 to 48 hours.  I will be on the look out for these results and will post those results to your MyChart account.  If there are any abnormal findings, I will also provide information for next steps.  I have also ordered x-rays of your left and right feet and ankles to be performed at [facility].  Please go during regular business hours during the week to the main entrance and let them know you were seen in urgent care and have x-rays ordered.  They will take care of you and you will not need to be admitted as a patient or pay any further co-pay that visit.  As we discussed, these x-ray orders are good for 1 year.  If you would like to take the indomethacin first to see if this is helpful and then decide whether or not you want to have the x-rays done or perhaps only have the left x-rays done, that is certainly your option.  As with your blood work, I will be on the look out for the results and we will provide those to you similarly.  For your pain, please begin taking indomethacin 1 capsule 3 times daily as needed.  While it is recommended that you take it with food, because your pain is worse in the morning, I recommend that you take your first capsule before you get out of bed with at least 8 ounces of water.  If it is possible, please also consider keeping an ice pack beside your bed overnight so that you can place an ice pack on your ankle before you get out of bed.  The next time you follow-up with your primary care provider or your podiatrist, please also discuss screening for autoimmune arthritis, more specifically rheumatoid as this type of arthritis classically presents with morning stiffness and pain.  Thank you for visiting urgent care today.

## 2021-09-18 NOTE — ED Notes (Signed)
This RN attempted phlebotomy x 1 without success after [employer] made attempts.  Able to see flash, no flow of blood.

## 2021-09-18 NOTE — ED Notes (Signed)
Attempted blood draw x 2 with slow blood flow and not enough output for collection. Will ask Katie, RN to attempt

## 2021-09-18 NOTE — ED Triage Notes (Signed)
Pt here with left ankle pain and swelling x 1 week. No mechanism of injury. Severe pain when  weight is put on it.

## 2021-09-18 NOTE — ED Provider Notes (Signed)
[facility]    CSN: [id number] Arrival date & time: 09/18/21  0820    HISTORY   Chief Complaint  Patient presents with   Ankle Pain   HPI Tamara Morton is a 56 y.o. female. Patient presents to urgent care today complaining of 1 week history of progressively worsening ankle pain, redness, warmth and swelling.  Patient states this morning and yesterday morning she was unable to bear weight on her left foot/ankle at all secondary to pain and had to walk with a crutch.  Patient states she is also noticed that she had a significant amount of stiffness with her pain that gradually loosens up as the morning goes on.  Patient denies prior injury to her left ankle or foot.  Patient does endorse a history of pes planus, currently being followed regularly by podiatry for this.  Patient that she does not recall the last time she had an x-ray of either ankle or foot.  Patient states she has tried Motrin for her pain with little relief.  Patient denies a history of gout.  Patient denies fever, aches, chills, red streaking up her left leg or on her left foot.  Patient denies any issues with her right ankle, right foot.  The history is provided by the patient.   Past Medical History:  Diagnosis Date   Chicken pox    Family history of breast cancer    Family history of colon cancer    Family history of ovarian cancer    Family history of prostate cancer    Family history of stomach cancer    History of stomach ulcers    Hypertension    Patient Active Problem List   Diagnosis Date Noted   COVID-19 virus infection 04/20/2019   Genetic testing 08/26/2017   Family history of colon cancer    Family history of stomach cancer    Family history of prostate cancer    Family history of ovarian cancer    Family history of breast cancer    Preventative health care 06/20/2016   Lateral epicondylitis of right elbow 11/26/2015   Knee LCL sprain 11/26/2015   HTN (hypertension) 11/11/2015    Past Surgical History:  Procedure Laterality Date   ABDOMINAL HYSTERECTOMY  2009   uterine fibroids  TAH BSO   FOOT SURGERY Right "About 6-7 yrs ago"   OB History   No obstetric history on file.    Home Medications    Prior to Admission medications   Medication Sig Start Date End Date Taking? Authorizing Provider  Cholecalciferol (VITAMIN D-3) 125 MCG (5000 UT) TABS Take 1 tablet by mouth daily. 02/22/20   Hilts, Legrand Como, MD  diltiazem (CARDIZEM CD) 300 MG 24 hr capsule Take 1 capsule (300 mg total) by mouth daily. 05/01/21   Ann Held, DO  spironolactone (ALDACTONE) 25 MG tablet Take 1 tablet (25 mg total) by mouth daily. 05/01/21   Ann Held, DO    Family History Family History  Problem Relation Age of Onset   Hypertension Mother    Gout Mother    Glaucoma Mother    Hypertension Father    Heart disease Father    Hematuria Father    Stomach cancer Father 63   Prostate cancer Maternal Uncle 61       metastatic   Colon cancer Maternal Aunt 65   Diabetes Maternal Grandmother    Diabetes Maternal Aunt    Ovarian cancer Maternal Aunt 72  Breast cancer Maternal Aunt 68   Lung cancer Maternal Aunt    Esophageal cancer Neg Hx    Rectal cancer Neg Hx    Social History Social History   Tobacco Use   Smoking status: Never   Smokeless tobacco: Never  Substance Use Topics   Alcohol use: Yes    Alcohol/week: 0.0 standard drinks of alcohol    Comment: rare   Drug use: No   Allergies   Patient has no known allergies.  Review of Systems Review of Systems Pertinent findings noted in history of present illness.   Physical Exam Triage Vital Signs ED Triage Vitals  Enc Vitals Group     BP 02/06/21 0827 (!) 147/82     Pulse Rate 02/06/21 0827 72     Resp 02/06/21 0827 18     Temp 02/06/21 0827 98.3 F (36.8 C)     Temp Source 02/06/21 0827 Oral     SpO2 02/06/21 0827 98 %     Weight --      Height --      Head Circumference --      Peak  Flow --      Pain Score 02/06/21 0826 5     Pain Loc --      Pain Edu? --      Excl. in [place]? --   No data found.  Updated Vital Signs BP 116/82   Pulse 65   Temp 98.6 F (37 C)   Resp 20   SpO2 98%   Physical Exam Vitals and nursing note reviewed.  Constitutional:      General: She is not in acute distress.    Appearance: Normal appearance. She is not ill-appearing.  HENT:     Head: Normocephalic and atraumatic.  Eyes:     General: Lids are normal.        Right eye: No discharge.        Left eye: No discharge.     Extraocular Movements: Extraocular movements intact.     Conjunctiva/sclera: Conjunctivae normal.     Right eye: Right conjunctiva is not injected.     Left eye: Left conjunctiva is not injected.  Neck:     Trachea: Trachea and phonation normal.  Cardiovascular:     Rate and Rhythm: Normal rate and regular rhythm.     Pulses: Normal pulses.     Heart sounds: Normal heart sounds. No murmur heard.    No friction rub. No gallop.  Pulmonary:     Effort: Pulmonary effort is normal. No accessory muscle usage, prolonged expiration or respiratory distress.     Breath sounds: Normal breath sounds. No stridor, decreased air movement or transmitted upper airway sounds. No decreased breath sounds, wheezing, rhonchi or rales.  Chest:     Chest wall: No tenderness.  Musculoskeletal:        General: Normal range of motion.     Cervical back: Normal range of motion and neck supple. Normal range of motion.     Right ankle: Normal.     Right Achilles Tendon: Normal.     Left ankle: No swelling, deformity, ecchymosis or lacerations. Tenderness present over the medial malleolus. No lateral malleolus, ATF ligament, AITF ligament, CF ligament, posterior TF ligament, base of 5th metatarsal or proximal fibula tenderness. Normal range of motion. Anterior drawer test negative. Normal pulse.     Left Achilles Tendon: Normal.     Right foot: Deformity (Pes planus) present.  Left  foot: Deformity (Pes planus) present.     Comments: Warmth and erythema medially over the medial malleolus  Lymphadenopathy:     Cervical: No cervical adenopathy.  Skin:    General: Skin is warm and dry.     Findings: No erythema or rash.  Neurological:     General: No focal deficit present.     Mental Status: She is alert and oriented to person, place, and time.  Psychiatric:        Mood and Affect: Mood normal.        Behavior: Behavior normal.     Visual Acuity Right Eye Distance:   Left Eye Distance:   Bilateral Distance:    Right Eye Near:   Left Eye Near:    Bilateral Near:     [facility] / Diagnostics / Procedures:    EKG  Radiology DG Ankle Complete Right  Result Date: 09/18/2021 CLINICAL DATA:  Pain EXAM: RIGHT ANKLE - COMPLETE 3+ VIEW COMPARISON:  None Available. FINDINGS: No recent fracture or dislocation is seen. Small plantar spur is seen in calcaneus. Minimal bony spurs are noted in the intertarsal joints seen in the lateral view. IMPRESSION: No acute findings are seen in the right ankle. Plantar spur is seen in calcaneus. Electronically Signed   By: Elmer Picker M.D.   On: 09/18/2021 11:38   DG Foot Complete Right  Result Date: 09/18/2021 CLINICAL DATA:  Pain EXAM: RIGHT FOOT COMPLETE - 3+ VIEW COMPARISON:  None Available. FINDINGS: No recent fracture or dislocation is seen. There is 2 mm smooth marginated calcification medial to the medial cuneiform, possibly residual from previous injury. There is metallic suture in the proximal phalanx of big toe residual from previous surgery. There is possible osteotomy in the medial aspect of head of the first metatarsal. Plantar spur is seen in calcaneus. There is flattening of plantar arch. IMPRESSION: No recent fracture or dislocation is seen. Postsurgical changes are noted in the head of the first metatarsal and proximal phalanx of right big toe. Plantar spur is seen in the calcaneus. There is flattening of plantar  arch. Electronically Signed   By: Elmer Picker M.D.   On: 09/18/2021 11:37   DG Foot Complete Left  Result Date: 09/18/2021 CLINICAL DATA:  Bilateral foot and ankle pain for 1 week, no known injury EXAM: LEFT ANKLE COMPLETE - 3+ VIEW; LEFT FOOT - COMPLETE 3+ VIEW COMPARISON:  None Available. FINDINGS: There is no evidence of fracture, dislocation, or joint effusion. Probable bunion correction osteotomy of the distal left first metatarsal. No associated arthrosis. Soft tissues are unremarkable. IMPRESSION: 1. No fracture or dislocation of the left foot or ankle. 2. Probable bunion correction osteotomy of the distal left first metatarsal. No associated arthrosis. Electronically Signed   By: Delanna Ahmadi M.D.   On: 09/18/2021 11:34   DG Ankle Complete Left  Result Date: 09/18/2021 CLINICAL DATA:  Bilateral foot and ankle pain for 1 week, no known injury EXAM: LEFT ANKLE COMPLETE - 3+ VIEW; LEFT FOOT - COMPLETE 3+ VIEW COMPARISON:  None Available. FINDINGS: There is no evidence of fracture, dislocation, or joint effusion. Probable bunion correction osteotomy of the distal left first metatarsal. No associated arthrosis. Soft tissues are unremarkable. IMPRESSION: 1. No fracture or dislocation of the left foot or ankle. 2. Probable bunion correction osteotomy of the distal left first metatarsal. No associated arthrosis. Electronically Signed   By: Delanna Ahmadi M.D.   On: 09/18/2021 11:34    Procedures  Procedures (including critical care time)  [facility] Diagnoses / Final Clinical Impressions(s)   I have reviewed the triage vital signs and the nursing notes.  Pertinent labs & imaging results that were available during my care of the patient were reviewed by me and considered in my medical decision making (see chart for details).    Final diagnoses:  Arthritis  Acute left ankle pain  Pes planus of both feet   Patient advised that the most likely cause of her pain, redness, and stiffness in her left  ankle at this time is gout.  I provided patient with a prescription for indomethacin that she can take 3 times daily as needed.  We checked a uric acid level and a CBC to evaluate her for gout and, less likely, septic arthritis.  Patient advised that we will provide her with this result emergency.  Patient also advised that I recommend x-ray of her left and right feet and ankles since she has not had this done for several years.  Patient states she will have this done when she has time.  Return precautions advised.  ED Prescriptions     Medication Sig Dispense Auth. Provider   indomethacin (INDOCIN) 50 MG capsule Take 1 capsule (50 mg total) by mouth 3 (three) times daily with meals for 14 days. 42 capsule Lynden Oxford Scales, PA-C      PDMP not reviewed this encounter.  Pending results:  Labs Reviewed  CBC WITH DIFFERENTIAL/PLATELET  URIC ACID    Medications Ordered in UC: Medications - No data to display  Disposition Upon Discharge:  Condition: stable for discharge home Home: take medications as prescribed; routine discharge instructions as discussed; follow up as advised.  Patient presented with an acute illness with associated systemic symptoms and significant discomfort requiring urgent management. In my opinion, this is a condition that a prudent lay person (someone who possesses an average knowledge of health and medicine) may potentially expect to result in complications if not addressed urgently such as respiratory distress, impairment of bodily function or dysfunction of bodily organs.   Routine symptom specific, illness specific and/or disease specific instructions were discussed with the patient and/or caregiver at length.   As such, the patient has been evaluated and assessed, work-up was performed and treatment was provided in alignment with urgent care protocols and evidence based medicine.  Patient/parent/caregiver has been advised that the patient may require follow up  for further testing and treatment if the symptoms continue in spite of treatment, as clinically indicated and appropriate.  Patient/parent/caregiver has been advised to return to the [facility] or PCP if no better; to PCP or the Emergency Department if new signs and symptoms develop, or if the current signs or symptoms continue to change or worsen for further workup, evaluation and treatment as clinically indicated and appropriate  The patient will follow up with their current PCP if and as advised. If the patient does not currently have a PCP we will assist them in obtaining one.   The patient may need specialty follow up if the symptoms continue, in spite of conservative treatment and management, for further workup, evaluation, consultation and treatment as clinically indicated and appropriate.   Patient/parent/caregiver verbalized understanding and agreement of plan as discussed.  All questions were addressed during visit.  Please see discharge instructions below for further details of plan.  Discharge Instructions:   Discharge Instructions      The results of your blood test, a uric acid level to screen for  gout and a CBC to screen for possible infection, should be available in the next 24 to 48 hours.  I will be on the look out for these results and will post those results to your MyChart account.  If there are any abnormal findings, I will also provide information for next steps.  I have also ordered x-rays of your left and right feet and ankles to be performed at [facility].  Please go during regular business hours during the week to the main entrance and let them know you were seen in urgent care and have x-rays ordered.  They will take care of you and you will not need to be admitted as a patient or pay any further co-pay that visit.  As we discussed, these x-ray orders are good for 1 year.  If you would like to take the indomethacin first to see if this is helpful and then decide  whether or not you want to have the x-rays done or perhaps only have the left x-rays done, that is certainly your option.  As with your blood work, I will be on the look out for the results and we will provide those to you similarly.  For your pain, please begin taking indomethacin 1 capsule 3 times daily as needed.  While it is recommended that you take it with food, because your pain is worse in the morning, I recommend that you take your first capsule before you get out of bed with at least 8 ounces of water.  If it is possible, please also consider keeping an ice pack beside your bed overnight so that you can place an ice pack on your ankle before you get out of bed.  The next time you follow-up with your primary care provider or your podiatrist, please also discuss screening for autoimmune arthritis, more specifically rheumatoid as this type of arthritis classically presents with morning stiffness and pain.  Thank you for visiting urgent care today.        This office note has been dictated using Museum/gallery curator.  Unfortunately, and despite my best efforts, this method of dictation can sometimes lead to occasional typographical or grammatical errors.  I apologize in advance if this occurs.     Lynden Oxford Scales, PA-C 09/18/21 1253

## 2021-09-19 LAB — CBC WITH DIFFERENTIAL/PLATELET
Basophils Absolute: 0 10*3/uL (ref 0.0–0.2)
Basos: 1 %
EOS (ABSOLUTE): 0 10*3/uL (ref 0.0–0.4)
Eos: 1 %
Hematocrit: 41.2 % (ref 34.0–46.6)
Hemoglobin: 13.8 g/dL (ref 11.1–15.9)
Immature Grans (Abs): 0 10*3/uL (ref 0.0–0.1)
Immature Granulocytes: 0 %
Lymphocytes Absolute: 3 10*3/uL (ref 0.7–3.1)
Lymphs: 54 %
MCH: 30.4 pg (ref 26.6–33.0)
MCHC: 33.5 g/dL (ref 31.5–35.7)
MCV: 91 fL (ref 79–97)
Monocytes Absolute: 0.3 10*3/uL (ref 0.1–0.9)
Monocytes: 5 %
Neutrophils Absolute: 2.2 10*3/uL (ref 1.4–7.0)
Neutrophils: 39 %
Platelets: 270 10*3/uL (ref 150–450)
RBC: 4.54 x10E6/uL (ref 3.77–5.28)
RDW: 13.3 % (ref 11.7–15.4)
WBC: 5.6 10*3/uL (ref 3.4–10.8)

## 2021-09-19 LAB — URIC ACID: Uric Acid: 5.7 mg/dL (ref 3.0–7.2)

## 2021-10-10 ENCOUNTER — Other Ambulatory Visit: Payer: Self-pay | Admitting: Family Medicine

## 2021-10-10 DIAGNOSIS — I10 Essential (primary) hypertension: Secondary | ICD-10-CM

## 2021-10-12 ENCOUNTER — Encounter: Payer: Self-pay | Admitting: Family Medicine

## 2021-10-12 ENCOUNTER — Other Ambulatory Visit (HOSPITAL_BASED_OUTPATIENT_CLINIC_OR_DEPARTMENT_OTHER): Payer: Self-pay | Admitting: Family Medicine

## 2021-10-12 ENCOUNTER — Ambulatory Visit (INDEPENDENT_AMBULATORY_CARE_PROVIDER_SITE_OTHER): Payer: Managed Care, Other (non HMO) | Admitting: Family Medicine

## 2021-10-12 VITALS — BP 108/78 | HR 60 | Temp 97.7°F | Resp 18 | Ht 67.0 in | Wt 221.6 lb

## 2021-10-12 DIAGNOSIS — Z0000 Encounter for general adult medical examination without abnormal findings: Secondary | ICD-10-CM | POA: Diagnosis not present

## 2021-10-12 DIAGNOSIS — Z23 Encounter for immunization: Secondary | ICD-10-CM

## 2021-10-12 DIAGNOSIS — Z1231 Encounter for screening mammogram for malignant neoplasm of breast: Secondary | ICD-10-CM

## 2021-10-12 DIAGNOSIS — E2839 Other primary ovarian failure: Secondary | ICD-10-CM

## 2021-10-12 LAB — CBC WITH DIFFERENTIAL/PLATELET
Basophils Absolute: 0 10*3/uL (ref 0.0–0.1)
Basophils Relative: 0.4 % (ref 0.0–3.0)
Eosinophils Absolute: 0.1 10*3/uL (ref 0.0–0.7)
Eosinophils Relative: 1 % (ref 0.0–5.0)
HCT: 39 % (ref 36.0–46.0)
Hemoglobin: 12.8 g/dL (ref 12.0–15.0)
Lymphocytes Relative: 43.6 % (ref 12.0–46.0)
Lymphs Abs: 3.6 10*3/uL (ref 0.7–4.0)
MCHC: 32.9 g/dL (ref 30.0–36.0)
MCV: 91 fl (ref 78.0–100.0)
Monocytes Absolute: 0.4 10*3/uL (ref 0.1–1.0)
Monocytes Relative: 4.7 % (ref 3.0–12.0)
Neutro Abs: 4.1 10*3/uL (ref 1.4–7.7)
Neutrophils Relative %: 50.3 % (ref 43.0–77.0)
Platelets: 257 10*3/uL (ref 150.0–400.0)
RBC: 4.28 Mil/uL (ref 3.87–5.11)
RDW: 14.7 % (ref 11.5–15.5)
WBC: 8.2 10*3/uL (ref 4.0–10.5)

## 2021-10-12 LAB — COMPREHENSIVE METABOLIC PANEL
ALT: 24 U/L (ref 0–35)
AST: 16 U/L (ref 0–37)
Albumin: 3.9 g/dL (ref 3.5–5.2)
Alkaline Phosphatase: 87 U/L (ref 39–117)
BUN: 20 mg/dL (ref 6–23)
CO2: 26 mEq/L (ref 19–32)
Calcium: 8.9 mg/dL (ref 8.4–10.5)
Chloride: 103 mEq/L (ref 96–112)
Creatinine, Ser: 1.19 mg/dL (ref 0.40–1.20)
GFR: 51.06 mL/min — ABNORMAL LOW (ref >60.00)
Glucose, Bld: 119 mg/dL — ABNORMAL HIGH (ref 70–99)
Potassium: 3.6 mEq/L (ref 3.5–5.1)
Sodium: 139 mEq/L (ref 135–145)
Total Bilirubin: 0.4 mg/dL (ref 0.2–1.2)
Total Protein: 7 g/dL (ref 6.0–8.3)

## 2021-10-12 LAB — LIPID PANEL
Cholesterol: 133 mg/dL (ref 0–200)
HDL: 49.5 mg/dL (ref >39.00)
LDL Cholesterol: 61 mg/dL (ref 0–99)
NonHDL: 83.05
Total CHOL/HDL Ratio: 3
Triglycerides: 108 mg/dL (ref 0.0–149.0)
VLDL: 21.6 mg/dL (ref 0.0–40.0)

## 2021-10-12 LAB — TSH: TSH: 4.62 u[IU]/mL (ref 0.35–5.50)

## 2021-10-12 NOTE — Patient Instructions (Signed)
Preventive Care 40-57 Years Old, Female Preventive care refers to lifestyle choices and visits with your health care provider that can promote health and wellness. Preventive care visits are also called wellness exams. What can I expect for my preventive care visit? Counseling Your health care provider may ask you questions about your: Medical history, including: Past medical problems. Family medical history. Pregnancy history. Current health, including: Menstrual cycle. Method of birth control. Emotional well-being. Home life and relationship well-being. Sexual activity and sexual health. Lifestyle, including: Alcohol, nicotine or tobacco, and drug use. Access to firearms. Diet, exercise, and sleep habits. Work and work environment. Sunscreen use. Safety issues such as seatbelt and bike helmet use. Physical exam Your health care provider will check your: Height and weight. These may be used to calculate your BMI (body mass index). BMI is a measurement that tells if you are at a healthy weight. Waist circumference. This measures the distance around your waistline. This measurement also tells if you are at a healthy weight and may help predict your risk of certain diseases, such as type 2 diabetes and high blood pressure. Heart rate and blood pressure. Body temperature. Skin for abnormal spots. What immunizations do I need?  Vaccines are usually given at various ages, according to a schedule. Your health care provider will recommend vaccines for you based on your age, medical history, and lifestyle or other factors, such as travel or where you work. What tests do I need? Screening Your health care provider may recommend screening tests for certain conditions. This may include: Lipid and cholesterol levels. Diabetes screening. This is done by checking your blood sugar (glucose) after you have not eaten for a while (fasting). Pelvic exam and Pap test. Hepatitis B test. Hepatitis C  test. HIV (human immunodeficiency virus) test. STI (sexually transmitted infection) testing, if you are at risk. Lung cancer screening. Colorectal cancer screening. Mammogram. Talk with your health care provider about when you should start having regular mammograms. This may depend on whether you have a family history of breast cancer. BRCA-related cancer screening. This may be done if you have a family history of breast, ovarian, tubal, or peritoneal cancers. Bone density scan. This is done to screen for osteoporosis. Talk with your health care provider about your test results, treatment options, and if necessary, the need for more tests. Follow these instructions at home: Eating and drinking  Eat a diet that includes fresh fruits and vegetables, whole grains, lean protein, and low-fat dairy products. Take vitamin and mineral supplements as recommended by your health care provider. Do not drink alcohol if: Your health care provider tells you not to drink. You are pregnant, may be pregnant, or are planning to become pregnant. If you drink alcohol: Limit how much you have to 0-1 drink a day. Know how much alcohol is in your drink. In the [place] one drink equals one 12 oz bottle of beer (355 mL), one 5 oz glass of wine (148 mL), or one 1 oz glass of hard liquor (44 mL). Lifestyle Brush your teeth every morning and night with fluoride toothpaste. Floss one time each day. Exercise for at least 30 minutes 5 or more days each week. Do not use any products that contain nicotine or tobacco. These products include cigarettes, chewing tobacco, and vaping devices, such as e-cigarettes. If you need help quitting, ask your health care provider. Do not use drugs. If you are sexually active, practice safe sex. Use a condom or other form of protection to   prevent STIs. If you do not wish to become pregnant, use a form of birth control. If you plan to become pregnant, see your health care provider for a  prepregnancy visit. Take aspirin only as told by your health care provider. Make sure that you understand how much to take and what form to take. Work with your health care provider to find out whether it is safe and beneficial for you to take aspirin daily. Find healthy ways to manage stress, such as: Meditation, yoga, or listening to music. Journaling. Talking to a trusted person. Spending time with friends and family. Minimize exposure to UV radiation to reduce your risk of skin cancer. Safety Always wear your seat belt while driving or riding in a vehicle. Do not drive: If you have been drinking alcohol. Do not ride with someone who has been drinking. When you are tired or distracted. While texting. If you have been using any mind-altering substances or drugs. Wear a helmet and other protective equipment during sports activities. If you have firearms in your house, make sure you follow all gun safety procedures. Seek help if you have been physically or sexually abused. What's next? Visit your health care provider once a year for an annual wellness visit. Ask your health care provider how often you should have your eyes and teeth checked. Stay up to date on all vaccines. This information is not intended to replace advice given to you by your health care provider. Make sure you discuss any questions you have with your health care provider. Document Revised: 09/24/2020 Document Reviewed: 09/24/2020 Elsevier Patient Education  [place].

## 2021-10-12 NOTE — Assessment & Plan Note (Signed)
ghm utd Check labs  See avs  Pt will schedule mammogram  dtap done today

## 2021-10-12 NOTE — Progress Notes (Signed)
Subjective:   By signing my name below, I, Carylon Perches, attest that this documentation has been prepared under the direction and in the presence of Ann Held DO 10/12/2021   Patient ID: Tamara Morton, female    DOB: 03-11-1966, 56 y.o.   MRN: [id number]  Chief Complaint  Patient presents with   Annual Exam    Pt states fasting     HPI Patient is in today for a comprehensive physical exam.   She is currently wearing a left foot brace for elevated swelling and pain in her LLE. She is not in physical therapy.   She received a hysterectomy about 12 years ago, she reports that everything was removed. She does not regularly see a gynecologist.   She denies having any fever, new muscle pain, joint pain , new moles, congestion, sinus pain, sore throat, chest pain, palpations, cough, SOB ,wheezing,n/v/d constipation, blood in stool, dysuria, frequency, hematuria, at this time  She denies of any changes to her family medical history.  Colonoscopy last completed on 06/12/2015 Mammogram last completed on 09/01/2015. She is not scheduled for a future mammogram exam. She is currently due for a tetanus vaccine and is interested in receiving one during today's visit. She currently has received two Covid-19 vaccines.  Before receiving her brace, she was not regularly exercising.  She is UTD on dental exams.  She is UTD on vision exams.     Past Medical History:  Diagnosis Date   Chicken pox    Family history of breast cancer    Family history of colon cancer    Family history of ovarian cancer    Family history of prostate cancer    Family history of stomach cancer    History of stomach ulcers    Hypertension     Past Surgical History:  Procedure Laterality Date   ABDOMINAL HYSTERECTOMY  2009   uterine fibroids  TAH BSO   FOOT SURGERY Right "About 6-7 yrs ago"    Family History  Problem Relation Age of Onset   Hypertension Mother    Gout Mother    Glaucoma Mother     Hypertension Father    Heart disease Father    Hematuria Father    Stomach cancer Father 44   Prostate cancer Maternal Uncle 61       metastatic   Colon cancer Maternal Aunt 65   Diabetes Maternal Grandmother    Diabetes Maternal Aunt    Ovarian cancer Maternal Aunt 62   Breast cancer Maternal Aunt 68   Lung cancer Maternal Aunt    Esophageal cancer Neg Hx    Rectal cancer Neg Hx     Social History   Socioeconomic History   Marital status: Single    Spouse name: Not on file   Number of children: Not on file   Years of education: Not on file   Highest education level: Not on file  Occupational History   Occupation: Designer, industrial/product    Comment: [employer]   Occupation: Buyer, retail: [employer]  Tobacco Use   Smoking status: Never   Smokeless tobacco: Never  Substance and Sexual Activity   Alcohol use: Yes    Alcohol/week: 0.0 standard drinks of alcohol    Comment: rare   Drug use: No   Sexual activity: Never  Other Topics Concern   Not on file  Social History Narrative   exericse-- no   Social Determinants of Health  Financial Resource Strain: Not on file  Food Insecurity: Not on file  Transportation Needs: Not on file  Physical Activity: Not on file  Stress: Not on file  Social Connections: Not on file  Intimate Partner Violence: Not on file    Outpatient Medications Prior to Visit  Medication Sig Dispense Refill   diltiazem (CARDIZEM CD) 300 MG 24 hr capsule Take 1 capsule (300 mg total) by mouth daily. 90 capsule 1   spironolactone (ALDACTONE) 25 MG tablet Take 1 tablet (25 mg total) by mouth daily. 90 tablet 1   Cholecalciferol (VITAMIN D-3) 125 MCG (5000 UT) TABS Take 1 tablet by mouth daily. (Patient not taking: Reported on 10/12/2021) 90 tablet 3   No facility-administered medications prior to visit.    No Known Allergies  Review of Systems  Constitutional:  Negative for chills, fever and malaise/fatigue.  HENT:  Negative for  congestion, hearing loss, sinus pain and sore throat.   Eyes:  Negative for discharge.  Respiratory:  Negative for cough, sputum production, shortness of breath and wheezing.   Cardiovascular:  Negative for chest pain, palpitations and leg swelling.  Gastrointestinal:  Negative for abdominal pain, blood in stool, constipation, diarrhea, heartburn, nausea and vomiting.  Genitourinary:  Negative for dysuria, frequency, hematuria and urgency.  Musculoskeletal:  Negative for back pain, falls, joint pain and myalgias.  Skin:  Negative for rash.       (-) New Moles  Neurological:  Negative for dizziness, sensory change, loss of consciousness, weakness and headaches.  Endo/Heme/Allergies:  Negative for environmental allergies. Does not bruise/bleed easily.  Psychiatric/Behavioral:  Negative for depression and suicidal ideas. The patient is not nervous/anxious and does not have insomnia.        Objective:    Physical Exam Vitals and nursing note reviewed.  Constitutional:      General: She is not in acute distress.    Appearance: Normal appearance. She is well-developed. She is not ill-appearing.  HENT:     Head: Normocephalic and atraumatic.     Right Ear: Tympanic membrane, ear canal and external ear normal.     Left Ear: Tympanic membrane, ear canal and external ear normal.     Nose: Nose normal.  Eyes:     Extraocular Movements: Extraocular movements intact.     Pupils: Pupils are equal, round, and reactive to light.  Cardiovascular:     Rate and Rhythm: Normal rate and regular rhythm.     Heart sounds: Normal heart sounds. No murmur heard.    No gallop.  Pulmonary:     Effort: Pulmonary effort is normal. No respiratory distress.     Breath sounds: Normal breath sounds. No wheezing or rales.  Chest:     Chest wall: No tenderness.  Abdominal:     General: Bowel sounds are normal. There is no distension.     Palpations: Abdomen is soft.     Tenderness: There is no abdominal  tenderness. There is no guarding.  Musculoskeletal:        General: Swelling present.     Cervical back: Normal range of motion and neck supple.     Comments: L foot in boot from podiatrist due to swelling / pain   Skin:    General: Skin is warm and dry.     Capillary Refill: Capillary refill takes less than 2 seconds.  Neurological:     Mental Status: She is alert and oriented to person, place, and time.  Psychiatric:  Behavior: Behavior normal.        Thought Content: Thought content normal.        Judgment: Judgment normal.     BP 108/78 (BP Location: Left Arm, Patient Position: Sitting, Cuff Size: Normal)   Pulse 60   Temp 97.7 F (36.5 C) (Oral)   Resp 18   Ht '5\' 7"'$  (1.702 m)   Wt 221 lb 9.6 oz (100.5 kg)   SpO2 96%   BMI 34.71 kg/m  Wt Readings from Last 3 Encounters:  10/12/21 221 lb 9.6 oz (100.5 kg)  05/01/21 221 lb 12.8 oz (100.6 kg)  01/08/20 213 lb 12.8 oz (97 kg)    Diabetic Foot Exam - Simple   No data filed    Lab Results  Component Value Date   WBC 5.6 09/18/2021   HGB 13.8 09/18/2021   HCT 41.2 09/18/2021   PLT 270 09/18/2021   GLUCOSE 79 05/01/2021   CHOL 146 05/01/2021   TRIG 150 (H) 05/01/2021   HDL 45 (L) 05/01/2021   LDLCALC 76 05/01/2021   ALT 18 05/01/2021   AST 16 05/01/2021   NA 141 05/01/2021   K 3.8 05/01/2021   CL 107 05/01/2021   CREATININE 1.25 (H) 05/01/2021   BUN 15 05/01/2021   CO2 25 05/01/2021   TSH 2.52 06/24/2017   MICROALBUR <0.7 05/09/2015    Lab Results  Component Value Date   TSH 2.52 06/24/2017   Lab Results  Component Value Date   WBC 5.6 09/18/2021   HGB 13.8 09/18/2021   HCT 41.2 09/18/2021   MCV 91 09/18/2021   PLT 270 09/18/2021   Lab Results  Component Value Date   NA 141 05/01/2021   K 3.8 05/01/2021   CO2 25 05/01/2021   GLUCOSE 79 05/01/2021   BUN 15 05/01/2021   CREATININE 1.25 (H) 05/01/2021   BILITOT 0.4 05/01/2021   ALKPHOS 85 10/03/2018   AST 16 05/01/2021   ALT 18  05/01/2021   PROT 7.5 05/01/2021   ALBUMIN 4.1 10/03/2018   CALCIUM 9.4 05/01/2021   GFR 62.72 10/03/2018   Lab Results  Component Value Date   CHOL 146 05/01/2021   Lab Results  Component Value Date   HDL 45 (L) 05/01/2021   Lab Results  Component Value Date   LDLCALC 76 05/01/2021   Lab Results  Component Value Date   TRIG 150 (H) 05/01/2021   Lab Results  Component Value Date   CHOLHDL 3.2 05/01/2021   No results found for: "HGBA1C"     Assessment & Plan:   Problem List Items Addressed This Visit       Unprioritized   Preventative health care - Primary    ghm utd Check labs  See avs  Pt will schedule mammogram  dtap done today      Relevant Orders   CBC with Differential/Platelet   Comprehensive metabolic panel   Lipid panel   TSH   Other Visit Diagnoses     Need for tetanus booster       Relevant Orders   Tdap vaccine greater than or equal to 7yo IM (Completed)   Estrogen deficiency       Relevant Orders   DG Bone Density       No orders of the defined types were placed in this encounter.   IAnn Held, DO, personally preformed the services described in this documentation.  All medical record entries made by the scribe were at  my direction and in my presence.  I have reviewed the chart and discharge instructions (if applicable) and agree that the record reflects my personal performance and is accurate and complete. 10/12/2021   I,Amber Collins,acting as a scribe for Ann Held, DO.,have documented all relevant documentation on the behalf of Ann Held, DO,as directed by  Ann Held, DO while in the presence of Ann Held, DO.   Ann Held, DO

## 2021-10-19 ENCOUNTER — Ambulatory Visit (HOSPITAL_BASED_OUTPATIENT_CLINIC_OR_DEPARTMENT_OTHER)
Admission: RE | Admit: 2021-10-19 | Discharge: 2021-10-19 | Disposition: A | Discharge status: Home / Self Care | Payer: Managed Care, Other (non HMO) | Source: Ambulatory Visit | Attending: Family Medicine | Admitting: Family Medicine

## 2021-10-19 ENCOUNTER — Encounter (HOSPITAL_BASED_OUTPATIENT_CLINIC_OR_DEPARTMENT_OTHER): Payer: Self-pay

## 2021-10-19 DIAGNOSIS — E2839 Other primary ovarian failure: Secondary | ICD-10-CM | POA: Diagnosis present

## 2021-10-19 DIAGNOSIS — Z1231 Encounter for screening mammogram for malignant neoplasm of breast: Secondary | ICD-10-CM | POA: Insufficient documentation

## 2021-10-30 ENCOUNTER — Encounter: Payer: Managed Care, Other (non HMO) | Admitting: Family Medicine

## 2022-05-18 ENCOUNTER — Telehealth: Payer: Self-pay | Admitting: Family Medicine

## 2022-05-18 DIAGNOSIS — I10 Essential (primary) hypertension: Secondary | ICD-10-CM

## 2022-05-18 MED ORDER — SPIRONOLACTONE 25 MG PO TABS: 25.0000 mg | ORAL_TABLET | Freq: Every day | ORAL | 1 refills | Status: DC

## 2022-05-18 MED ORDER — DILTIAZEM HCL ER COATED BEADS 300 MG PO CP24: 300.0000 mg | ORAL_CAPSULE | Freq: Every day | ORAL | 1 refills | Status: DC

## 2022-05-18 NOTE — Telephone Encounter (Signed)
Refills sent

## 2022-05-18 NOTE — Telephone Encounter (Signed)
Prescription Request  05/18/2022  Is this a "Controlled Substance" medicine? No  LOV: Visit date not found  What is the name of the medication or equipment?   diltiazem (CARDIZEM CD) 300 MG 24 hr capsule [[id number]]   spironolactone (ALDACTONE) 25 MG tablet [[id number]]   Have you contacted your pharmacy to request a refill? Yes   Which pharmacy would you like this sent to?   [id number] [address]Phone:: [phone]:: [phone] Patient notified that their request is being sent to the clinical staff for review and that they should receive a response within 2 business days.   Please advise at Mobile [phone](mobile)

## 2022-10-15 ENCOUNTER — Encounter: Payer: Self-pay | Admitting: Family Medicine

## 2022-10-15 ENCOUNTER — Ambulatory Visit (INDEPENDENT_AMBULATORY_CARE_PROVIDER_SITE_OTHER): Payer: Managed Care, Other (non HMO) | Admitting: Family Medicine

## 2022-10-15 ENCOUNTER — Ambulatory Visit (HOSPITAL_BASED_OUTPATIENT_CLINIC_OR_DEPARTMENT_OTHER)
Admission: RE | Admit: 2022-10-15 | Discharge: 2022-10-15 | Disposition: A | Discharge status: Home / Self Care | Payer: Managed Care, Other (non HMO) | Source: Ambulatory Visit | Attending: Family Medicine | Admitting: Family Medicine

## 2022-10-15 VITALS — BP 110/80 | HR 60 | Temp 98.3°F | Resp 18 | Ht 67.0 in | Wt 213.6 lb

## 2022-10-15 DIAGNOSIS — M25572 Pain in left ankle and joints of left foot: Secondary | ICD-10-CM

## 2022-10-15 DIAGNOSIS — I10 Essential (primary) hypertension: Secondary | ICD-10-CM

## 2022-10-15 DIAGNOSIS — Z0000 Encounter for general adult medical examination without abnormal findings: Secondary | ICD-10-CM | POA: Diagnosis not present

## 2022-10-15 LAB — COMPREHENSIVE METABOLIC PANEL
ALT: 21 U/L (ref 0–35)
AST: 19 U/L (ref 0–37)
Albumin: 4.1 g/dL (ref 3.5–5.2)
Alkaline Phosphatase: 81 U/L (ref 39–117)
BUN: 14 mg/dL (ref 6–23)
CO2: 26 mEq/L (ref 19–32)
Calcium: 9.6 mg/dL (ref 8.4–10.5)
Chloride: 106 mEq/L (ref 96–112)
Creatinine, Ser: 1.18 mg/dL (ref 0.40–1.20)
GFR: 51.22 mL/min — ABNORMAL LOW (ref >60.00)
Glucose, Bld: 100 mg/dL — ABNORMAL HIGH (ref 70–99)
Potassium: 4 mEq/L (ref 3.5–5.1)
Sodium: 141 mEq/L (ref 135–145)
Total Bilirubin: 0.4 mg/dL (ref 0.2–1.2)
Total Protein: 7.5 g/dL (ref 6.0–8.3)

## 2022-10-15 LAB — CBC WITH DIFFERENTIAL/PLATELET
Basophils Absolute: 0 10*3/uL (ref 0.0–0.1)
Basophils Relative: 0.5 % (ref 0.0–3.0)
Eosinophils Absolute: 0.1 10*3/uL (ref 0.0–0.7)
Eosinophils Relative: 2 % (ref 0.0–5.0)
HCT: 40.1 % (ref 36.0–46.0)
Hemoglobin: 13.1 g/dL (ref 12.0–15.0)
Lymphocytes Relative: 47.4 % — ABNORMAL HIGH (ref 12.0–46.0)
Lymphs Abs: 2.4 10*3/uL (ref 0.7–4.0)
MCHC: 32.6 g/dL (ref 30.0–36.0)
MCV: 92.2 fl (ref 78.0–100.0)
Monocytes Absolute: 0.3 10*3/uL (ref 0.1–1.0)
Monocytes Relative: 5.1 % (ref 3.0–12.0)
Neutro Abs: 2.3 10*3/uL (ref 1.4–7.7)
Neutrophils Relative %: 45 % (ref 43.0–77.0)
Platelets: 271 10*3/uL (ref 150.0–400.0)
RBC: 4.35 Mil/uL (ref 3.87–5.11)
RDW: 14.5 % (ref 11.5–15.5)
WBC: 5.2 10*3/uL (ref 4.0–10.5)

## 2022-10-15 LAB — URIC ACID: Uric Acid, Serum: 6.3 mg/dL (ref 2.4–7.0)

## 2022-10-15 LAB — LIPID PANEL
Cholesterol: 150 mg/dL (ref 0–200)
HDL: 47.6 mg/dL (ref >39.00)
LDL Cholesterol: 88 mg/dL (ref 0–99)
NonHDL: 102.27
Total CHOL/HDL Ratio: 3
Triglycerides: 71 mg/dL (ref 0.0–149.0)
VLDL: 14.2 mg/dL (ref 0.0–40.0)

## 2022-10-15 LAB — TSH: TSH: 2.59 u[IU]/mL (ref 0.35–5.50)

## 2022-10-15 MED ORDER — DILTIAZEM HCL ER COATED BEADS 300 MG PO CP24: 300.0000 mg | ORAL_CAPSULE | Freq: Every day | ORAL | 3 refills | Status: DC

## 2022-10-15 MED ORDER — SPIRONOLACTONE 25 MG PO TABS: 25.0000 mg | ORAL_TABLET | Freq: Every day | ORAL | 3 refills | Status: DC

## 2022-10-15 NOTE — Progress Notes (Signed)
Established Patient Office Visit  Subjective   Patient ID: Tamara Morton, female    DOB: 22-Oct-1965  Age: 57 y.o. MRN: [id number]  Chief Complaint  Patient presents with   Annual Exam    Pt states fasting     HPI Discussed the use of AI scribe software for clinical note transcription with the patient, who gave verbal consent to proceed.  History of Present Illness   The patient, with a history of hypertension and hyperaldosteronism managed with Cardizem and spironolactone, presents for a physical and medication refills. They report persistent foot pain and swelling, initially in the right foot and now in the left. The discomfort is localized from the ankle to the mid-foot and is worse after a day of work.      Patient Active Problem List   Diagnosis Date Noted   COVID-19 virus infection 04/20/2019   Genetic testing 08/26/2017   Family history of colon cancer    Family history of stomach cancer    Family history of prostate cancer    Family history of ovarian cancer    Family history of breast cancer    Preventative health care 06/20/2016   Lateral epicondylitis of right elbow 11/26/2015   Knee LCL sprain 11/26/2015   HTN (hypertension) 11/11/2015   Past Medical History:  Diagnosis Date   Chicken pox    Family history of breast cancer    Family history of colon cancer    Family history of ovarian cancer    Family history of prostate cancer    Family history of stomach cancer    History of stomach ulcers    Hypertension    Past Surgical History:  Procedure Laterality Date   ABDOMINAL HYSTERECTOMY  2009   uterine fibroids  TAH BSO   FOOT SURGERY Right "About 6-7 yrs ago"   Social History   Tobacco Use   Smoking status: Never   Smokeless tobacco: Never  Substance Use Topics   Alcohol use: Yes    Alcohol/week: 0.0 standard drinks of alcohol    Comment: rare   Drug use: No   Social History   Socioeconomic History   Marital status: Single    Spouse name:  Not on file   Number of children: Not on file   Years of education: Not on file   Highest education level: Not on file  Occupational History   Occupation: Scientist, physiological    Comment: [employer]   Occupation: Event organiser: [employer]  Tobacco Use   Smoking status: Never   Smokeless tobacco: Never  Substance and Sexual Activity   Alcohol use: Yes    Alcohol/week: 0.0 standard drinks of alcohol    Comment: rare   Drug use: No   Sexual activity: Never  Other Topics Concern   Not on file  Social History Narrative   exericse-- no   Social Determinants of Health   Financial Resource Strain: Not on file  Food Insecurity: Not on file  Transportation Needs: Not on file  Physical Activity: Not on file  Stress: Not on file  Social Connections: Not on file  Intimate Partner Violence: Not on file   Family Status  Relation Name Status   Mother  Alive   Father  Deceased at age 27   Mat Uncle  Deceased   Mat Aunt  Deceased   MGM  Deceased   Mat Aunt  (Not Specified)   Mat Aunt  Deceased   Mat Aunt  Alive   Brother 2 pat half [employer] Aunt  Alive   Pat Uncle  Deceased   MGF  Deceased   PGM  Deceased   PGF  Deceased   Mat Aunt  (Not Specified)   Neg Hx  (Not Specified)   Family History  Problem Relation Age of Onset   Hypertension Mother    Gout Mother    Glaucoma Mother    Hypertension Father    Heart disease Father    Hematuria Father    Stomach cancer Father 61   Prostate cancer Maternal Uncle 61       metastatic   Colon cancer Maternal Aunt 75   Diabetes Maternal Grandmother    Diabetes Maternal Aunt    Ovarian cancer Maternal Aunt 62   Breast cancer Maternal Aunt 68   Lung cancer Maternal Aunt    Esophageal cancer Neg Hx    Rectal cancer Neg Hx    No Known Allergies    Review of Systems  Constitutional:  Negative for fever and malaise/fatigue.  HENT:  Negative for congestion.   Eyes:  Negative for blurred vision.  Respiratory:   Negative for cough and shortness of breath.   Cardiovascular:  Negative for chest pain, palpitations and leg swelling.  Gastrointestinal:  Negative for vomiting.  Musculoskeletal:  Negative for back pain.  Skin:  Negative for rash.  Neurological:  Negative for loss of consciousness and headaches.      Objective:     BP 110/80 (BP Location: Right Arm, Patient Position: Sitting, Cuff Size: Large)   Pulse 60   Temp 98.3 F (36.8 C) (Oral)   Resp 18   Ht 5\' 7"  (1.702 m)   Wt 213 lb 9.6 oz (96.9 kg)   SpO2 96%   BMI 33.45 kg/m  BP Readings from Last 3 Encounters:  10/15/22 110/80  10/12/21 108/78  09/18/21 116/82   Wt Readings from Last 3 Encounters:  10/15/22 213 lb 9.6 oz (96.9 kg)  10/12/21 221 lb 9.6 oz (100.5 kg)  05/01/21 221 lb 12.8 oz (100.6 kg)   SpO2 Readings from Last 3 Encounters:  10/15/22 96%  10/12/21 96%  09/18/21 98%      Physical Exam Vitals and nursing note reviewed.  Constitutional:      General: She is not in acute distress.    Appearance: Normal appearance. She is well-developed and normal weight.  HENT:     Head: Normocephalic and atraumatic.     Right Ear: Tympanic membrane, ear canal and external ear normal.     Left Ear: Tympanic membrane, ear canal and external ear normal.     Nose: Nose normal.  Eyes:     General: No scleral icterus.       Right eye: No discharge.        Left eye: No discharge.  Cardiovascular:     Rate and Rhythm: Normal rate and regular rhythm.     Heart sounds: No murmur heard. Pulmonary:     Effort: Pulmonary effort is normal. No respiratory distress.     Breath sounds: Normal breath sounds.  Abdominal:     General: Bowel sounds are normal.     Palpations: Abdomen is soft.     Tenderness: There is no abdominal tenderness. There is no guarding or rebound.     Hernia: No hernia is present.  Musculoskeletal:        General: Swelling present. Normal range of motion.  Cervical back: Normal range of motion and  neck supple.     Right lower leg: No edema.     Left lower leg: No edema.     Right ankle: Normal.     Left ankle: Swelling present. Tenderness present over the medial malleolus.  Skin:    General: Skin is warm and dry.  Neurological:     General: No focal deficit present.     Mental Status: She is alert and oriented to person, place, and time.  Psychiatric:        Mood and Affect: Mood normal.        Behavior: Behavior normal.        Thought Content: Thought content normal.        Judgment: Judgment normal.      No results found for any visits on 10/15/22.  Last CBC Lab Results  Component Value Date   WBC 8.2 10/12/2021   HGB 12.8 10/12/2021   HCT 39.0 10/12/2021   MCV 91.0 10/12/2021   MCH 30.4 09/18/2021   RDW 14.7 10/12/2021   PLT 257.0 10/12/2021   Last metabolic panel Lab Results  Component Value Date   GLUCOSE 119 (H) 10/12/2021   NA 139 10/12/2021   K 3.6 10/12/2021   CL 103 10/12/2021   CO2 26 10/12/2021   BUN 20 10/12/2021   CREATININE 1.19 10/12/2021   GFR 51.06 (L) 10/12/2021   CALCIUM 8.9 10/12/2021   PROT 7.0 10/12/2021   ALBUMIN 3.9 10/12/2021   BILITOT 0.4 10/12/2021   ALKPHOS 87 10/12/2021   AST 16 10/12/2021   ALT 24 10/12/2021   Last lipids Lab Results  Component Value Date   CHOL 133 10/12/2021   HDL 49.50 10/12/2021   LDLCALC 61 10/12/2021   TRIG 108.0 10/12/2021   CHOLHDL 3 10/12/2021   Last hemoglobin A1c No results found for: "HGBA1C" Last thyroid functions Lab Results  Component Value Date   TSH 4.62 10/12/2021   Last vitamin D No results found for: "25OHVITD2", "25OHVITD3", "VD25OH" Last vitamin B12 and Folate No results found for: "VITAMINB12", "FOLATE"    The 10-year ASCVD risk score (Arnett DK, et al., 2019) is: 2.6%    Assessment & Plan:   Problem List Items Addressed This Visit       Unprioritized   Preventative health care - Primary    Ghm utd Check labs  See AVS Health Maintenance  Topic Date Due    COVID-19 Vaccine (3 - 2023-24 season) 10/31/2022 (Originally 12/11/2021)   MAMMOGRAM  10/20/2022   INFLUENZA VACCINE  11/11/2022   Colonoscopy  06/11/2025   DTaP/Tdap/Td (3 - Td or Tdap) 10/13/2031   Hepatitis C Screening  Completed   HIV Screening  Completed   Zoster Vaccines- Shingrix  Completed   HPV VACCINES  Aged Out        Relevant Orders   Lipid panel   Comprehensive metabolic panel   CBC with Differential/Platelet   TSH   HTN (hypertension)    Well controlled, no changes to meds. Encouraged heart healthy diet such as the DASH diet and exercise as tolerated.        Relevant Medications   diltiazem (CARDIZEM CD) 300 MG 24 hr capsule   spironolactone (ALDACTONE) 25 MG tablet   Other Visit Diagnoses     Essential hypertension       Relevant Medications   diltiazem (CARDIZEM CD) 300 MG 24 hr capsule   spironolactone (ALDACTONE) 25 MG tablet   Other  Relevant Orders   Lipid panel   Comprehensive metabolic panel   CBC with Differential/Platelet   Acute left ankle pain       Relevant Orders   DG Ankle Complete Left   Uric acid     Assessment and Plan    Foot Pain: Patient reports persistent pain and swelling in the right foot, from the ankle to the mid-foot. No known injury. Previous treatment for fallen arch in the left foot. Unclear if right foot was previously imaged. -Order X-ray of the right foot to assess for any structural abnormalities or injury.  Hypertension: Stable on current regimen. -Refill Cardizem and Spironolactone prescriptions through [facility].  General Health Maintenance: Up to date on immunizations and screenings. -Continue current regimen. -Complete routine labs today.        No follow-ups on file.    Donato Schultz, DO

## 2022-10-15 NOTE — Assessment & Plan Note (Signed)
Ghm utd Check labs  See AVS Health Maintenance  Topic Date Due   COVID-19 Vaccine (3 - 2023-24 season) 10/31/2022 (Originally 12/11/2021)   MAMMOGRAM  10/20/2022   INFLUENZA VACCINE  11/11/2022   Colonoscopy  06/11/2025   DTaP/Tdap/Td (3 - Td or Tdap) 10/13/2031   Hepatitis C Screening  Completed   HIV Screening  Completed   Zoster Vaccines- Shingrix  Completed   HPV VACCINES  Aged Out

## 2022-10-15 NOTE — Assessment & Plan Note (Signed)
Well controlled, no changes to meds. Encouraged heart healthy diet such as the DASH diet and exercise as tolerated.  °

## 2022-10-28 ENCOUNTER — Other Ambulatory Visit: Payer: Self-pay | Admitting: Family Medicine

## 2022-10-28 DIAGNOSIS — I10 Essential (primary) hypertension: Secondary | ICD-10-CM

## 2023-07-03 ENCOUNTER — Ambulatory Visit: Admission: EM | Admit: 2023-07-03 | Discharge: 2023-07-03 | Disposition: A | Discharge status: Home / Self Care

## 2023-07-03 DIAGNOSIS — H00014 Hordeolum externum left upper eyelid: Secondary | ICD-10-CM | POA: Diagnosis not present

## 2023-07-03 MED ORDER — ERYTHROMYCIN 5 MG/GM OP OINT: TOPICAL_OINTMENT | OPHTHALMIC | 0 refills | Status: DC

## 2023-07-03 NOTE — ED Triage Notes (Signed)
 Pt presents with stye on left eye. Pt noticed yesterday. Left eye is in pain and does affect vision when driving.

## 2023-07-03 NOTE — Discharge Instructions (Signed)
 You have a stye. Use warm compresses as we discussed. Follow up with eye doctor if symptoms persist or worsen.

## 2023-07-03 NOTE — ED Provider Notes (Signed)
 [facility]    CSN: [id number] Arrival date & time: 07/03/23  0813      History   Chief Complaint Chief Complaint  Patient presents with   Stye    HPI Tamara Morton is a 58 y.o. female.   Patient presents with concern of pain and swelling to the left upper eyelid that started yesterday.  Denies any injury to the area.  Denies vision changes other than swelling slightly obstructing her view.     Past Medical History:  Diagnosis Date   Chicken pox    Family history of breast cancer    Family history of colon cancer    Family history of ovarian cancer    Family history of prostate cancer    Family history of stomach cancer    History of stomach ulcers    Hypertension     Patient Active Problem List   Diagnosis Date Noted   COVID-19 virus infection 04/20/2019   Genetic testing 08/26/2017   Family history of colon cancer    Family history of stomach cancer    Family history of prostate cancer    Family history of ovarian cancer    Family history of breast cancer    Preventative health care 06/20/2016   Lateral epicondylitis of right elbow 11/26/2015   Knee LCL sprain 11/26/2015   HTN (hypertension) 11/11/2015    Past Surgical History:  Procedure Laterality Date   ABDOMINAL HYSTERECTOMY  2009   uterine fibroids  TAH BSO   FOOT SURGERY Right "About 6-7 yrs ago"    OB History   No obstetric history on file.      Home Medications    Prior to Admission medications   Medication Sig Start Date End Date Taking? Authorizing Provider  ciprofloxacin (CILOXAN) 0.3 % ophthalmic solution 1 drop every hour while awake until seen by ophthalmology 12/03/20  Yes [provider]  diltiazem (CARDIZEM CD) 300 MG 24 hr capsule Take 1 capsule (300 mg total) by mouth daily. 10/28/22  Yes Seabron Spates R, DO  erythromycin ophthalmic ointment Place a 1/2 inch ribbon of ointment into the eye 4 times daily for 7 days.. 07/03/23  Yes Gustavus Bryant, FNP   indomethacin (INDOCIN) 50 MG capsule  09/19/21  Yes [provider]  moxifloxacin (VIGAMOX) 0.5 % ophthalmic solution Apply to eye. 12/03/20  Yes [provider]  spironolactone (ALDACTONE) 25 MG tablet Take 1 tablet (25 mg total) by mouth daily. 10/15/22  Yes Donato Schultz, DO  diltiazem (DILACOR XR) 240 MG 24 hr capsule Oral 01/28/17   [provider]    Family History Family History  Problem Relation Age of Onset   Hypertension Mother    Gout Mother    Glaucoma Mother    Hypertension Father    Heart disease Father    Hematuria Father    Stomach cancer Father 28   Prostate cancer Maternal Uncle 61       metastatic   Colon cancer Maternal Aunt 67   Diabetes Maternal Grandmother    Diabetes Maternal Aunt    Ovarian cancer Maternal Aunt 62   Breast cancer Maternal Aunt 68   Lung cancer Maternal Aunt    Esophageal cancer Neg Hx    Rectal cancer Neg Hx     Social History Social History   Tobacco Use   Smoking status: Never   Smokeless tobacco: Never  Substance Use Topics   Alcohol use: Yes  Alcohol/week: 0.0 standard drinks of alcohol    Comment: rare   Drug use: No     Allergies   Patient has no known allergies.   Review of Systems Review of Systems Per HPI  Physical Exam Triage Vital Signs ED Triage Vitals  Encounter Vitals Group     BP 07/03/23 0848 113/74     Systolic BP Percentile --      Diastolic BP Percentile --      Pulse Rate 07/03/23 0848 (!) 56     Resp 07/03/23 0848 18     Temp 07/03/23 0848 97.8 F (36.6 C)     Temp Source 07/03/23 0848 Oral     SpO2 07/03/23 0848 95 %     Weight --      Height --      Head Circumference --      Peak Flow --      Pain Score 07/03/23 0846 6     Pain Loc --      Pain Education --      Exclude from Growth Chart --    No data found.  Updated Vital Signs BP 113/74 (BP Location: Left Arm)   Pulse (!) 56   Temp 97.8 F (36.6 C) (Oral)   Resp 18   SpO2 95%   Visual  Acuity Right Eye Distance:   Left Eye Distance:   Bilateral Distance:    Right Eye Near:   Left Eye Near:    Bilateral Near:     Physical Exam Constitutional:      General: She is not in acute distress.    Appearance: Normal appearance. She is not toxic-appearing or diaphoretic.  HENT:     Head: Normocephalic and atraumatic.  Eyes:     General: Lids are everted, no foreign bodies appreciated. Vision grossly intact. Gaze aligned appropriately.        Left eye: Hordeolum present.    Extraocular Movements: Extraocular movements intact.     Conjunctiva/sclera: Conjunctivae normal.     Pupils: Pupils are equal, round, and reactive to light.     Comments: Patient has area of swelling present to mid upper eyelid.  No drainage noted.  Pulmonary:     Effort: Pulmonary effort is normal.  Neurological:     General: No focal deficit present.     Mental Status: She is alert and oriented to person, place, and time. Mental status is at baseline.  Psychiatric:        Mood and Affect: Mood normal.        Behavior: Behavior normal.        Thought Content: Thought content normal.        Judgment: Judgment normal.      UC Treatments / Results  Labs (all labs ordered are listed, but only abnormal results are displayed) Labs Reviewed - No data to display  EKG   Radiology No results found.  Procedures Procedures (including critical care time)  Medications Ordered in UC Medications - No data to display  Initial Impression / Assessment and Plan / UC Course  I have reviewed the triage vital signs and the nursing notes.  Pertinent labs & imaging results that were available during my care of the patient were reviewed by me and considered in my medical decision making (see chart for details).     Patient has stye to left upper eyelid.  Advised patient to use warm compresses.  Will add erythromycin ointment.  Educated patient on  proper application of ointment.  Visual acuity appears  normal.  Advised patient to follow-up with her established eye doctor if symptoms persist or worsen.  Patient verbalized understanding and was agreeable with plan. Final Clinical Impressions(s) / UC Diagnoses   Final diagnoses:  Hordeolum externum of left upper eyelid     Discharge Instructions      You have a stye. Use warm compresses as we discussed. Follow up with eye doctor if symptoms persist or worsen.     ED Prescriptions     Medication Sig Dispense Auth. Provider   erythromycin ophthalmic ointment Place a 1/2 inch ribbon of ointment into the eye 4 times daily for 7 days.. 3.5 g Gustavus Bryant, [place]      PDMP not reviewed this encounter.   Gustavus Bryant, [place] 07/03/23 [id number]

## 2023-09-30 ENCOUNTER — Other Ambulatory Visit: Payer: Self-pay | Admitting: Family Medicine

## 2023-09-30 DIAGNOSIS — I10 Essential (primary) hypertension: Secondary | ICD-10-CM

## 2023-10-10 ENCOUNTER — Encounter: Payer: Self-pay | Admitting: Family Medicine

## 2023-10-10 ENCOUNTER — Ambulatory Visit (INDEPENDENT_AMBULATORY_CARE_PROVIDER_SITE_OTHER): Admitting: Family Medicine

## 2023-10-10 VITALS — BP 130/72 | HR 63 | Temp 97.8°F | Resp 18 | Ht 67.0 in | Wt 217.2 lb

## 2023-10-10 DIAGNOSIS — R42 Dizziness and giddiness: Secondary | ICD-10-CM

## 2023-10-10 DIAGNOSIS — I10 Essential (primary) hypertension: Secondary | ICD-10-CM

## 2023-10-10 DIAGNOSIS — R001 Bradycardia, unspecified: Secondary | ICD-10-CM

## 2023-10-10 LAB — COMPREHENSIVE METABOLIC PANEL WITH GFR
ALT: 27 U/L (ref 0–35)
AST: 20 U/L (ref 0–37)
Albumin: 4.4 g/dL (ref 3.5–5.2)
Alkaline Phosphatase: 93 U/L (ref 39–117)
BUN: 13 mg/dL (ref 6–23)
CO2: 25 meq/L (ref 19–32)
Calcium: 9.1 mg/dL (ref 8.4–10.5)
Chloride: 103 meq/L (ref 96–112)
Creatinine, Ser: 1.17 mg/dL (ref 0.40–1.20)
GFR: 51.39 mL/min — ABNORMAL LOW (ref >60.00)
Glucose, Bld: 103 mg/dL — ABNORMAL HIGH (ref 70–99)
Potassium: 3.6 meq/L (ref 3.5–5.1)
Sodium: 138 meq/L (ref 135–145)
Total Bilirubin: 0.4 mg/dL (ref 0.2–1.2)
Total Protein: 7.9 g/dL (ref 6.0–8.3)

## 2023-10-10 LAB — CBC WITH DIFFERENTIAL/PLATELET
Basophils Absolute: 0 10*3/uL (ref 0.0–0.1)
Basophils Relative: 0.4 % (ref 0.0–3.0)
Eosinophils Absolute: 0.1 10*3/uL (ref 0.0–0.7)
Eosinophils Relative: 1.2 % (ref 0.0–5.0)
HCT: 41.3 % (ref 36.0–46.0)
Hemoglobin: 13.7 g/dL (ref 12.0–15.0)
Lymphocytes Relative: 41.4 % (ref 12.0–46.0)
Lymphs Abs: 2.4 10*3/uL (ref 0.7–4.0)
MCHC: 33.1 g/dL (ref 30.0–36.0)
MCV: 91.4 fl (ref 78.0–100.0)
Monocytes Absolute: 0.3 10*3/uL (ref 0.1–1.0)
Monocytes Relative: 5 % (ref 3.0–12.0)
Neutro Abs: 3.1 10*3/uL (ref 1.4–7.7)
Neutrophils Relative %: 52 % (ref 43.0–77.0)
Platelets: 247 10*3/uL (ref 150.0–400.0)
RBC: 4.52 Mil/uL (ref 3.87–5.11)
RDW: 14.4 % (ref 11.5–15.5)
WBC: 5.9 10*3/uL (ref 4.0–10.5)

## 2023-10-10 LAB — LIPID PANEL
Cholesterol: 151 mg/dL (ref 0–200)
HDL: 50.3 mg/dL (ref >39.00)
LDL Cholesterol: 87 mg/dL (ref 0–99)
NonHDL: 101.11
Total CHOL/HDL Ratio: 3
Triglycerides: 71 mg/dL (ref 0.0–149.0)
VLDL: 14.2 mg/dL (ref 0.0–40.0)

## 2023-10-10 LAB — VITAMIN B12: Vitamin B-12: 415 pg/mL (ref 211–911)

## 2023-10-10 LAB — TSH: TSH: 2.34 u[IU]/mL (ref 0.35–5.50)

## 2023-10-10 LAB — VITAMIN D 25 HYDROXY (VIT D DEFICIENCY, FRACTURES): VITD: 19.48 ng/mL — ABNORMAL LOW (ref 30.00–100.00)

## 2023-10-10 MED ORDER — DILTIAZEM HCL ER 240 MG PO CP24: 240.0000 mg | ORAL_CAPSULE | Freq: Every day | ORAL | 1 refills | Status: DC

## 2023-10-10 NOTE — Progress Notes (Signed)
 Established Patient Office Visit  Subjective   Patient ID: Tamara Morton, female    DOB: 05-03-1965  Age: 58 y.o. MRN: [id number]  Chief Complaint  Patient presents with   Dizziness    X1 month, pt states having multiply episodes and the last one was on Thursday. No chest pain or palpitations     HPI Discussed the use of AI scribe software for clinical note transcription with the patient, who gave verbal consent to proceed.  History of Present Illness Tamara Morton is a 58 year old female who presents with dizziness and low heart rate.  She has been experiencing dizziness for the past month, initially attributing it to low blood sugar from not eating. The dizziness recurred this month, with a particularly severe episode occurring last Thursday, during which she felt unable to focus and disoriented. 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 She describes the sensation as feeling like she was spinning, rather than the room spinning. No associated nausea or vomiting was noted.  No chest pain, palpitations, or ear congestion. Her vision remains unaffected during these episodes. She monitors her pulse with a watch, noting it sometimes drops to 48 beats per minute at night. Her home blood pressure readings have occasionally been high, reaching 170s. She has been on 300 mg of diltiazem  for approximately two years.  She resides in [place].   Patient Active Problem List   Diagnosis Date Noted   COVID-19 virus infection 04/20/2019   Genetic testing 08/26/2017   Family history of colon cancer    Family history of stomach cancer    Family history of prostate cancer    Family history of ovarian cancer    Family history of breast cancer    Preventative health care 06/20/2016   Lateral epicondylitis of right elbow 11/26/2015   Knee LCL sprain 11/26/2015   HTN (hypertension) 11/11/2015   Past Medical History:  Diagnosis Date   Chicken  pox    Family history of breast cancer    Family history of colon cancer    Family history of ovarian cancer    Family history of prostate cancer    Family history of stomach cancer    History of stomach ulcers    Hypertension    Past Surgical History:  Procedure Laterality Date   ABDOMINAL HYSTERECTOMY  2009   uterine fibroids  TAH BSO   FOOT SURGERY Right About 6-7 yrs ago   Social History   Tobacco Use   Smoking status: Never   Smokeless tobacco: Never  Substance Use Topics   Alcohol use: Yes    Alcohol/week: 0.0 standard drinks of alcohol    Comment: rare   Drug use: No   Social History   Socioeconomic History   Marital status: Single    Spouse name: Not on file   Number of children: Not on file   Years of education: Not on file   Highest education level: Bachelor's degree (e.g., BA, AB, BS)  Occupational History   Occupation: Scientist, physiological    Comment: [employer]   Occupation: Event organiser: [employer]  Tobacco Use   Smoking status: Never   Smokeless tobacco: Never  Substance and Sexual Activity   Alcohol use: Yes    Alcohol/week: 0.0 standard drinks of alcohol    Comment: rare   Drug use: No   Sexual activity: Never  Other Topics Concern   Not  on file  Social History Narrative   exericse-- no   Social Drivers of Corporate investment banker Strain: Low Risk  (10/06/2023)   Overall Financial Resource Strain (CARDIA)    Difficulty of Paying Living Expenses: Not hard at all  Food Insecurity: No Food Insecurity (10/06/2023)   Hunger Vital Sign    Worried About Running Out of Food in the Last Year: Never true    Ran Out of Food in the Last Year: Never true  Transportation Needs: No Transportation Needs (10/06/2023)   PRAPARE - Administrator, Civil Service (Medical): No    Lack of Transportation (Non-Medical): No  Physical Activity: Inactive (10/06/2023)   Exercise Vital Sign    Days of Exercise per Week: 0 days    Minutes  of Exercise per Session: Not on file  Stress: No Stress Concern Present (10/06/2023)   [employer] of Occupational Health - Occupational Stress Questionnaire    Feeling of Stress: Not at all  Social Connections: Moderately Integrated (10/06/2023)   Social Connection and Isolation Panel    Frequency of Communication with Friends and Family: More than three times a week    Frequency of Social Gatherings with Friends and Family: More than three times a week    Attends Religious Services: More than 4 times per year    Active Member of [employer] or Organizations: Yes    Attends Banker Meetings: More than 4 times per year    Marital Status: Never married  Intimate Partner Violence: Unknown (07/14/2021)   Received from [facility]   HITS    Physically Hurt: Not on file    Insult or Talk Down To: Not on file    Threaten Physical Harm: Not on file    Scream or Curse: Not on file   Family Status  Relation Name Status   Mother  Alive   Father  Deceased at age 87   Mat Uncle  Deceased   Mat Aunt  Deceased   MGM  Deceased   Mat Aunt  (Not Specified)   Mat Aunt  Deceased   Mat Aunt  Alive   Brother 2 pat half Alive   Pat Aunt  Alive   Pat Uncle  Deceased   MGF  Deceased   PGM  Deceased   PGF  Deceased   Mat Aunt  (Not Specified)   Neg Hx  (Not Specified)  No partnership data on file   Family History  Problem Relation Age of Onset   Hypertension Mother    Gout Mother    Glaucoma Mother    Hypertension Father    Heart disease Father    Hematuria Father    Stomach cancer Father 45   Prostate cancer Maternal Uncle 61       metastatic   Colon cancer Maternal Aunt 65   Diabetes Maternal Grandmother    Diabetes Maternal Aunt    Ovarian cancer Maternal Aunt 62   Breast cancer Maternal Aunt 68   Lung cancer Maternal Aunt    Esophageal cancer Neg Hx    Rectal cancer Neg Hx    No Known Allergies    Review of Systems  Constitutional:  Negative for fever.  HENT:   Negative for congestion.   Eyes:  Negative for blurred vision, double vision and photophobia.  Respiratory:  Negative for cough.   Cardiovascular:  Negative for chest pain, palpitations and leg swelling.  Gastrointestinal:  Negative for vomiting.  Musculoskeletal:  Negative for back pain.  Skin:  Negative for rash.  Neurological:  Positive for dizziness. Negative for focal weakness, loss of consciousness, weakness and headaches.  Psychiatric/Behavioral:  Negative for depression, hallucinations, memory loss, substance abuse and suicidal ideas. The patient is not nervous/anxious and does not have insomnia.       Objective:     BP 130/72 (BP Location: Left Arm, Patient Position: Sitting, Cuff Size: Large)   Pulse 63   Temp 97.8 F (36.6 C) (Oral)   Resp 18   Ht 5' 7 (1.702 m)   Wt 217 lb 3.2 oz (98.5 kg)   SpO2 97%   BMI 34.02 kg/m  BP Readings from Last 3 Encounters:  10/10/23 130/72  07/03/23 113/74  10/15/22 110/80   Wt Readings from Last 3 Encounters:  10/10/23 217 lb 3.2 oz (98.5 kg)  10/15/22 213 lb 9.6 oz (96.9 kg)  10/12/21 221 lb 9.6 oz (100.5 kg)   SpO2 Readings from Last 3 Encounters:  10/10/23 97%  07/03/23 95%  10/15/22 96%      Physical Exam Vitals and nursing note reviewed.  Constitutional:      General: She is not in acute distress.    Appearance: Normal appearance. She is well-developed.  HENT:     Head: Normocephalic and atraumatic.     Right Ear: Tympanic membrane, ear canal and external ear normal. There is no impacted cerumen.     Left Ear: Tympanic membrane, ear canal and external ear normal. There is no impacted cerumen.     Nose: Nose normal.     Mouth/Throat:     Mouth: Mucous membranes are moist.     Pharynx: Oropharynx is clear. No oropharyngeal exudate or posterior oropharyngeal erythema.   Eyes:     General: No scleral icterus.       Right eye: No discharge.        Left eye: No discharge.     Conjunctiva/sclera: Conjunctivae  normal.     Pupils: Pupils are equal, round, and reactive to light.   Neck:     Thyroid : No thyromegaly or thyroid  tenderness.     Vascular: No JVD.   Cardiovascular:     Rate and Rhythm: Regular rhythm. Bradycardia present.     Heart sounds: Normal heart sounds. No murmur heard. Pulmonary:     Effort: Pulmonary effort is normal. No respiratory distress.     Breath sounds: Normal breath sounds.  Abdominal:     General: Bowel sounds are normal. There is no distension.     Palpations: Abdomen is soft. There is no mass.     Tenderness: There is no abdominal tenderness. There is no guarding or rebound.  Genitourinary:    Vagina: Normal.   Musculoskeletal:        General: Normal range of motion.     Cervical back: Normal range of motion and neck supple.     Right lower leg: No edema.     Left lower leg: No edema.  Lymphadenopathy:     Cervical: No cervical adenopathy.   Skin:    General: Skin is warm and dry.     Findings: No erythema or rash.   Neurological:     Mental Status: She is alert and oriented to person, place, and time.     Cranial Nerves: No cranial nerve deficit.     Deep Tendon Reflexes: Reflexes are normal and symmetric.   Psychiatric:        Mood and Affect:  Mood normal.        Behavior: Behavior normal.        Thought Content: Thought content normal.        Judgment: Judgment normal.      No results found for any visits on 10/10/23.  Last CBC Lab Results  Component Value Date   WBC 5.2 10/15/2022   HGB 13.1 10/15/2022   HCT 40.1 10/15/2022   MCV 92.2 10/15/2022   MCH 30.4 09/18/2021   RDW 14.5 10/15/2022   PLT 271.0 10/15/2022   Last metabolic panel Lab Results  Component Value Date   GLUCOSE 100 (H) 10/15/2022   NA 141 10/15/2022   K 4.0 10/15/2022   CL 106 10/15/2022   CO2 26 10/15/2022   BUN 14 10/15/2022   CREATININE 1.18 10/15/2022   GFR 51.22 (L) 10/15/2022   CALCIUM 9.6 10/15/2022   PROT 7.5 10/15/2022   ALBUMIN 4.1 10/15/2022    BILITOT 0.4 10/15/2022   ALKPHOS 81 10/15/2022   AST 19 10/15/2022   ALT 21 10/15/2022   Last lipids Lab Results  Component Value Date   CHOL 150 10/15/2022   HDL 47.60 10/15/2022   LDLCALC 88 10/15/2022   TRIG 71.0 10/15/2022   CHOLHDL 3 10/15/2022  EKG--- sinus bradycardia  Last hemoglobin A1c No results found for: HGBA1C Last thyroid  functions Lab Results  Component Value Date   TSH 2.59 10/15/2022   Last vitamin D  No results found for: 25OHVITD2, 25OHVITD3, VD25OH Last vitamin B12 and Folate No results found for: VITAMINB12, FOLATE    The 10-year ASCVD risk score (Arnett DK, et al., 2019) is: 5.4%    Assessment & Plan:   Problem List Items Addressed This Visit   None Visit Diagnoses       Dizziness    -  Primary   Relevant Orders   EKG 12-Lead (Completed)   CBC with Differential/Platelet   Comprehensive metabolic panel with GFR   Lipid panel   TSH   Vitamin B12   VITAMIN D  25 Hydroxy (Vit-D Deficiency, Fractures)     Essential hypertension       Relevant Medications   diltiazem  (DILACOR XR ) 240 MG 24 hr capsule   Other Relevant Orders   CBC with Differential/Platelet   Comprehensive metabolic panel with GFR   Lipid panel   TSH   Vitamin B12   VITAMIN D  25 Hydroxy (Vit-D Deficiency, Fractures)     Bradycardia       Relevant Orders   Ambulatory referral to Cardiology     Assessment and Plan Assessment & Plan Bradycardia   She experiences episodes of dizziness, with a significant episode last Thursday. Her heart rate is low, with a pulse of 48 at night and 49 on the EKG. Dizziness is likely related to bradycardia, possibly exacerbated by the current diltiazem  dosage of 300 mg. Bradycardia is suspected to be the cause of dizziness rather than an otologic issue. Informed consent was provided to decrease diltiazem  dosage to 240 mg to increase heart rate, with the understanding that blood pressure may need additional management. Monitor blood  pressure at home twice weekly and report if elevated. Refer to cardiology for further evaluation of bradycardia. Order blood work to assess overall health status.  Hypertension   She has occasional elevated blood pressure readings at home, with some reaching 170. The current diltiazem  dosage may need adjustment to manage both heart rate and blood pressure effectively. Discussed the potential need to add a medication if blood  pressure remains high after adjusting diltiazem . Monitor blood pressure at home and report if consistently 170 or higher. Consider adding a medication to manage blood pressure if it remains high after adjusting diltiazem .    Return in about 1 week (around 10/17/2023), or if symptoms worsen or fail to improve, for recheck bp and hr.    Joann Jorge R Lowne Chase, DO

## 2023-10-10 NOTE — Patient Instructions (Signed)
 Bradycardia, Adult Bradycardia is a slower-than-normal heartbeat. A normal resting heart rate for an adult ranges from 60 to 100 beats per minute. With bradycardia, the resting heart rate is less than 60 beats per minute. Bradycardia can prevent enough oxygen from reaching certain areas of your body when you are active. It can be serious if it keeps enough oxygen from reaching your brain and other parts of your body. Bradycardia is not a problem for everyone. For some healthy adults, a slow resting heart rate is normal. What are the causes? This condition may be caused by: A problem with the heart, including: A problem with the heart's electrical system, such as a heart block. With a heart block, electrical signals between the chambers of the heart are partially or completely blocked, so they are not able to work as they should. A problem with the heart's natural pacemaker (sinus node). Heart disease. A heart attack. Heart damage. Lyme disease. A heart infection. A heart condition that is present at birth (congenital heart defect). Certain medicines that treat heart conditions. Certain conditions, such as hypothyroidism and obstructive sleep apnea. Problems with the balance of chemicals and other substances, like potassium, in the blood. Trauma. Radiation therapy. What increases the risk? You are more likely to develop this condition if you: Are age 51 or older. Have high blood pressure (hypertension), high cholesterol (hyperlipidemia), or diabetes. Drink heavily, use tobacco or nicotine products, or use drugs. What are the signs or symptoms? Symptoms of this condition include: Light-headedness. Feeling faint or fainting. Fatigue and weakness. Trouble with activity or exercise. Shortness of breath. Chest pain (angina). Drowsiness. Confusion. Dizziness. How is this diagnosed? This condition may be diagnosed based on: Your symptoms. Your medical history. A physical exam. During  the exam, your health care provider will listen to your heartbeat and check your pulse. To confirm the diagnosis, your health care provider may order tests, such as: Blood tests. An electrocardiogram (ECG). This test records the heart's electrical activity. The test can show how fast your heart is beating and whether the heartbeat is steady. A test in which you wear a portable device (event recorder or Holter monitor) to record your heart's electrical activity while you go about your day. An exercise test. How is this treated? Treatment for this condition depends on the cause of the condition and how severe your symptoms are. Treatment may involve: Treatment of the underlying condition. Changing your medicines or how much medicine you take. Having a small, battery-operated device called a pacemaker implanted under the skin. When bradycardia occurs, this device can be used to increase your heart rate and help your heart beat in a regular rhythm. Follow these instructions at home: Lifestyle Manage any health conditions that contribute to bradycardia as told by your health care provider. Follow a heart-healthy diet. A nutrition specialist (dietitian) can help educate you about healthy food options and changes. Follow an exercise program that is approved by your health care provider. Maintain a healthy weight. Try to reduce or manage your stress, such as with yoga or meditation. If you need help reducing stress, ask your health care provider. Do not use any products that contain nicotine or tobacco. These products include cigarettes, chewing tobacco, and vaping devices, such as e-cigarettes. If you need help quitting, ask your health care provider. Do not use illegal drugs. Alcohol use If you drink alcohol: Limit how much you have to: 0-1 drink a day for women who are not pregnant. 0-2 drinks a day  for men. Know how much alcohol is in a drink. In the [place] one drink equals one 12 oz bottle of  beer (355 mL), one 5 oz glass of wine (148 mL), or one 1 oz glass of hard liquor (44 mL). General instructions Take over-the-counter and prescription medicines only as told by your health care provider. Keep all follow-up visits. This is important. How is this prevented? In some cases, bradycardia may be prevented by: Treating underlying medical problems. Stopping behaviors or medicines that can trigger the condition. Contact a health care provider if: You feel light-headed or dizzy. You almost faint. You feel weak or are easily fatigued during physical activity. You experience confusion or have memory problems. Get help right away if: You faint. You have chest pains or an irregular heartbeat (palpitations). You have trouble breathing. These symptoms may represent a serious problem that is an emergency. Do not wait to see if the symptoms will go away. Get medical help right away. Call your local emergency services (911 in the [place] Do not drive yourself to the hospital. Summary Bradycardia is a slower-than-normal heartbeat. With bradycardia, the resting heart rate is less than 60 beats per minute. Treatment for this condition depends on the cause. Manage any health conditions that contribute to bradycardia as told by your health care provider. Do not use any products that contain nicotine or tobacco. These products include cigarettes, chewing tobacco, and vaping devices, such as e-cigarettes. Keep all follow-up visits. This is important. This information is not intended to replace advice given to you by your health care provider. Make sure you discuss any questions you have with your health care provider. Document Revised: 07/20/2020 Document Reviewed: 07/20/2020 Elsevier Patient Education  2024 [employer].

## 2023-10-17 ENCOUNTER — Ambulatory Visit: Payer: Self-pay | Admitting: Family Medicine

## 2023-10-17 ENCOUNTER — Ambulatory Visit (INDEPENDENT_AMBULATORY_CARE_PROVIDER_SITE_OTHER): Admitting: Family Medicine

## 2023-10-17 ENCOUNTER — Encounter: Payer: Self-pay | Admitting: Family Medicine

## 2023-10-17 DIAGNOSIS — I10 Essential (primary) hypertension: Secondary | ICD-10-CM | POA: Diagnosis not present

## 2023-10-17 MED ORDER — DILTIAZEM HCL ER 240 MG PO CP24: 240.0000 mg | ORAL_CAPSULE | Freq: Every day | ORAL | 1 refills | Status: AC

## 2023-10-17 NOTE — Patient Instructions (Signed)
 Hypotension As the heart beats, it forces blood through the body. Hypotension, commonly called low blood pressure, is when the force of blood pumping through the arteries is too weak. Arteries are blood vessels that carry blood from the heart throughout the body. Depending on the cause and severity, hypotension may be harmless (benign) or may cause serious problems (be critical). When your blood pressure is too low, you may not get enough blood to your brain or to the rest of your organs. This can cause weakness, light-headedness, a rapid heartbeat, and fainting. What are the causes? This condition may be caused by: Blood loss. Loss of body fluids (dehydration). Heart problems. Hormone (endocrine) problems. Pregnancy. Severe infection. Lack of certain nutrients. Severe allergic reactions (anaphylaxis). Certain medicines, such as blood pressure medicine or medicines that make the body lose excess fluids (diuretics). Sometimes, hypotension may be caused by not taking medicine as directed, such as taking too much of a certain medicine. What increases the risk? The following factors may make you more likely to develop this condition: Age. Risk increases as you get older. Having a condition that affects the heart or the central nervous system. What are the signs or symptoms? Common symptoms of this condition include: Weakness. Light-headedness. Dizziness. Blurred vision. Tiredness (fatigue). Rapid heartbeat. Fainting, in severe cases. How is this diagnosed? This condition is diagnosed based on: Your medical history. Your symptoms. Your blood pressure measurement. Your health care provider will check your blood pressure when you are: Lying down. Sitting. Standing. A blood pressure reading is recorded as two numbers, such as "120 over 80" (or 120/80). The first ("top") number is called the systolic pressure. It is a measure of the pressure in your arteries as your heart beats. The second  ("bottom") number is called the diastolic pressure. It is a measure of the pressure in your arteries when your heart relaxes between beats. Blood pressure is measured in a unit called mm Hg. Healthy blood pressure for most adults is 120/80. If your blood pressure is below 90/60, you may be diagnosed with hypotension. Other information or tests that may be used to diagnose hypotension include: Your other vital signs, such as your heart rate and temperature. Blood tests. Tilt table test. For this test, you will be safely secured to a table that moves you from a lying position to an upright position. Your heart rhythm and blood pressure will be monitored during the test. How is this treated? Treatment for this condition may include: Changing your diet. This may involve drinking more water or increasing your salt (sodium) intake with high-sodium foods. Taking medicines to raise your blood pressure. Changing the dosage of certain medicines you are taking that might be lowering your blood pressure. Wearing compression stockings. These stockings help to prevent blood clots and reduce swelling in your legs. In some cases, you may need to go to the hospital for: Fluid replacement. This means you will receive fluids through an IV. Blood replacement. This means you will receive donated blood through an IV (transfusion). Treating an infection or heart problems, if this applies. Monitoring. You may need to be monitored while medicines that you are taking wear off. Follow these instructions at home: Eating and drinking  Drink enough fluid to keep your urine pale yellow. Eat a healthy diet, and follow instructions from your health care provider about eating or drinking restrictions. A healthy diet includes: Fresh fruits and vegetables. Whole grains. Lean meats. Low-fat dairy products. Increase your salt intake if told  to do so. Do not add extra salt to your diet unless your health care provider tells you  to do that. Eat frequent, small meals. Avoid standing up suddenly after eating. Medicines Take over-the-counter and prescription medicines only as told by your health care provider. Follow instructions from your health care provider about changing the dosage of your current medicines, if this applies. Do not stop or adjust any of your medicines on your own. General instructions  Wear compression stockings as told by your health care provider. Get up slowly from lying down or sitting positions. This gives your blood pressure a chance to adjust. Avoid hot showers and excessive heat as directed by your health care provider. Return to your normal activities as told by your health care provider. Ask your health care provider what activities are safe for you. Do not use any products that contain nicotine or tobacco. These products include cigarettes, chewing tobacco, and vaping devices, such as e-cigarettes. If you need help quitting, ask your health care provider. Keep all follow-up visits. This is important. Contact a health care provider if: You vomit. You have diarrhea. You have a fever for more than 2-3 days. You feel more thirsty than usual. You feel weak and tired. Get help right away if: You have chest pain. You have a fast or irregular heartbeat. You develop numbness in any part of your body. You cannot move your arms or your legs. You have trouble speaking. You become sweaty or feel light-headed. You faint. You feel short of breath. You have trouble staying awake. You feel confused. These symptoms may be an emergency. Get help right away. Call 911. Do not wait to see if the symptoms will go away. Do not drive yourself to the hospital. Summary Hypotension is when the force of blood pumping through the arteries is too weak. Hypotension may be harmless (benign) or may cause serious problems (be critical). Treatment for this condition may include changing your diet, changing  your medicines, and wearing compression stockings. In some cases, you may need to go to the hospital for fluid or blood replacement. This information is not intended to replace advice given to you by your health care provider. Make sure you discuss any questions you have with your health care provider. Document Revised: 11/17/2020 Document Reviewed: 11/17/2020 Elsevier Patient Education  2024 [employer].

## 2023-10-17 NOTE — Progress Notes (Signed)
 ++  Established Patient Office Visit  Subjective   Patient ID: Tamara Morton, female    DOB: 26-Feb-1966  Age: 58 y.o. MRN: [id number]  Chief Complaint  Patient presents with   Hypertension   Dizziness   Follow-up    HPI  Patient Active Problem List   Diagnosis Date Noted   COVID-19 virus infection 04/20/2019   Genetic testing 08/26/2017   Family history of colon cancer    Family history of stomach cancer    Family history of prostate cancer    Family history of ovarian cancer    Family history of breast cancer    Preventative health care 06/20/2016   Lateral epicondylitis of right elbow 11/26/2015   Knee LCL sprain 11/26/2015   HTN (hypertension) 11/11/2015   Past Medical History:  Diagnosis Date   Chicken pox    Family history of breast cancer    Family history of colon cancer    Family history of ovarian cancer    Family history of prostate cancer    Family history of stomach cancer    History of stomach ulcers    Hypertension    Past Surgical History:  Procedure Laterality Date   ABDOMINAL HYSTERECTOMY  2009   uterine fibroids  TAH BSO   FOOT SURGERY Right About 6-7 yrs ago   Social History   Tobacco Use   Smoking status: Never   Smokeless tobacco: Never  Substance Use Topics   Alcohol use: Yes    Alcohol/week: 0.0 standard drinks of alcohol    Comment: rare   Drug use: No   Social History   Socioeconomic History   Marital status: Single    Spouse name: Not on file   Number of children: Not on file   Years of education: Not on file   Highest education level: Bachelor's degree (e.g., BA, AB, BS)  Occupational History   Occupation: Scientist, physiological    Comment: [employer]   Occupation: Event organiser: [employer]  Tobacco Use   Smoking status: Never   Smokeless tobacco: Never  Substance and Sexual Activity   Alcohol use: Yes    Alcohol/week: 0.0 standard drinks of alcohol    Comment: rare   Drug use: No   Sexual activity:  Never  Other Topics Concern   Not on file  Social History Narrative   exericse-- no   Social Drivers of Corporate investment banker Strain: Low Risk  (10/06/2023)   Overall Financial Resource Strain (CARDIA)    Difficulty of Paying Living Expenses: Not hard at all  Food Insecurity: No Food Insecurity (10/06/2023)   Hunger Vital Sign    Worried About Running Out of Food in the Last Year: Never true    Ran Out of Food in the Last Year: Never true  Transportation Needs: No Transportation Needs (10/06/2023)   PRAPARE - Administrator, Civil Service (Medical): No    Lack of Transportation (Non-Medical): No  Physical Activity: Inactive (10/06/2023)   Exercise Vital Sign    Days of Exercise per Week: 0 days    Minutes of Exercise per Session: Not on file  Stress: No Stress Concern Present (10/06/2023)   [employer] of Occupational Health - Occupational Stress Questionnaire    Feeling of Stress: Not at all  Social Connections: Moderately Integrated (10/06/2023)   Social Connection and Isolation Panel    Frequency of Communication with Friends and Family: More than three times a week  Frequency of Social Gatherings with Friends and Family: More than three times a week    Attends Religious Services: More than 4 times per year    Active Member of [employer] or Organizations: Yes    Attends Banker Meetings: More than 4 times per year    Marital Status: Never married  Intimate Partner Violence: Unknown (07/14/2021)   Received from [facility]   HITS    Physically Hurt: Not on file    Insult or Talk Down To: Not on file    Threaten Physical Harm: Not on file    Scream or Curse: Not on file   Family Status  Relation Name Status   Mother  Alive   Father  Deceased at age 82   Mat Uncle  Deceased   Mat Aunt  Deceased   MGM  Deceased   Mat Aunt  (Not Specified)   Mat Aunt  Deceased   Mat Aunt  Alive   Brother 2 pat half Alive   Pat Aunt  Alive   Pat Uncle   Deceased   MGF  Deceased   PGM  Deceased   PGF  Deceased   Mat Aunt  (Not Specified)   Neg Hx  (Not Specified)  No partnership data on file   Family History  Problem Relation Age of Onset   Hypertension Mother    Gout Mother    Glaucoma Mother    Hypertension Father    Heart disease Father    Hematuria Father    Stomach cancer Father 74   Prostate cancer Maternal Uncle 61       metastatic   Colon cancer Maternal Aunt 65   Diabetes Maternal Grandmother    Diabetes Maternal Aunt    Ovarian cancer Maternal Aunt 62   Breast cancer Maternal Aunt 68   Lung cancer Maternal Aunt    Esophageal cancer Neg Hx    Rectal cancer Neg Hx    No Known Allergies    Review of Systems  Constitutional:  Negative for fever and malaise/fatigue.  HENT:  Negative for congestion.   Eyes:  Negative for blurred vision.  Respiratory:  Negative for shortness of breath.   Cardiovascular:  Negative for chest pain, palpitations and leg swelling.  Gastrointestinal:  Negative for abdominal pain, blood in stool and nausea.  Genitourinary:  Negative for dysuria and frequency.  Musculoskeletal:  Negative for falls.  Skin:  Negative for rash.  Neurological:  Negative for dizziness, loss of consciousness and headaches.  Endo/Heme/Allergies:  Negative for environmental allergies.  Psychiatric/Behavioral:  Negative for depression. The patient is not nervous/anxious.       Objective:     BP 104/78 (BP Location: Left Arm, Patient Position: Sitting, Cuff Size: Large)   Pulse (!) 59   Temp 97.9 F (36.6 C) (Oral)   Resp 18   Ht 5' 7 (1.702 m)   Wt 216 lb 3.2 oz (98.1 kg)   SpO2 97%   BMI 33.86 kg/m  BP Readings from Last 3 Encounters:  10/17/23 104/78  10/10/23 130/72  07/03/23 113/74   Wt Readings from Last 3 Encounters:  10/17/23 216 lb 3.2 oz (98.1 kg)  10/10/23 217 lb 3.2 oz (98.5 kg)  10/15/22 213 lb 9.6 oz (96.9 kg)      Physical Exam Vitals and nursing note reviewed.   Constitutional:      General: She is not in acute distress.    Appearance: Normal appearance. She is well-developed.  HENT:     Head: Normocephalic and atraumatic.  Eyes:     General: No scleral icterus.       Right eye: No discharge.        Left eye: No discharge.  Cardiovascular:     Rate and Rhythm: Normal rate and regular rhythm.     Heart sounds: No murmur heard. Pulmonary:     Effort: Pulmonary effort is normal. No respiratory distress.     Breath sounds: Normal breath sounds.  Musculoskeletal:        General: Normal range of motion.     Cervical back: Normal range of motion and neck supple.     Right lower leg: No edema.     Left lower leg: No edema.  Skin:    General: Skin is warm and dry.  Neurological:     Mental Status: She is alert and oriented to person, place, and time.  Psychiatric:        Mood and Affect: Mood normal.        Behavior: Behavior normal.        Thought Content: Thought content normal.        Judgment: Judgment normal.      No results found for any visits on 10/17/23.  Last CBC Lab Results  Component Value Date   WBC 5.9 10/10/2023   HGB 13.7 10/10/2023   HCT 41.3 10/10/2023   MCV 91.4 10/10/2023   MCH 30.4 09/18/2021   RDW 14.4 10/10/2023   PLT 247.0 10/10/2023   Last metabolic panel Lab Results  Component Value Date   GLUCOSE 103 (H) 10/10/2023   NA 138 10/10/2023   K 3.6 10/10/2023   CL 103 10/10/2023   CO2 25 10/10/2023   BUN 13 10/10/2023   CREATININE 1.17 10/10/2023   GFR 51.39 (L) 10/10/2023   CALCIUM 9.1 10/10/2023   PROT 7.9 10/10/2023   ALBUMIN 4.4 10/10/2023   BILITOT 0.4 10/10/2023   ALKPHOS 93 10/10/2023   AST 20 10/10/2023   ALT 27 10/10/2023   Last lipids Lab Results  Component Value Date   CHOL 151 10/10/2023   HDL 50.30 10/10/2023   LDLCALC 87 10/10/2023   TRIG 71.0 10/10/2023   CHOLHDL 3 10/10/2023   Last hemoglobin A1c No results found for: HGBA1C Last thyroid  functions Lab Results   Component Value Date   TSH 2.34 10/10/2023   Last vitamin D  Lab Results  Component Value Date   VD25OH 19.48 (L) 10/10/2023   Last vitamin B12 and Folate Lab Results  Component Value Date   VITAMINB12 415 10/10/2023      The 10-year ASCVD risk score (Arnett DK, et al., 2019) is: 2.6%    Assessment & Plan:   Problem List Items Addressed This Visit   None Visit Diagnoses       Essential hypertension       Relevant Medications   diltiazem  (DILACOR XR ) 240 MG 24 hr capsule     Assessment and Plan Assessment & Plan Bradycardia   Bradycardia persists due to a delay in adjusting her medication dosage. The pharmacy has not filled the prescription for the lower dose, necessitating continued use of the higher dose, which may exacerbate her condition. Send the prescription to [facility] on [address] for the lower dose medication. Instruct her to contact the office if there are further issues with the pharmacy filling the prescription. Have Heather call [facility] if necessary to ensure the lower dose is provided.  Hypotension  Hypotension is present, though she has not experienced significant dizziness recently. This remains a concern, particularly with concurrent bradycardia. Monitor blood pressure and symptoms closely.  Referral to Cardiology   She has not yet received an appointment with cardiology despite a referral being made. Further evaluation and management of her cardiac condition are necessary. Send a message to the referral person to check on the status of the cardiology appointment.    No follow-ups on file.    Marycarmen Hagey R Lowne Chase, DO

## 2023-10-19 ENCOUNTER — Other Ambulatory Visit: Payer: Self-pay

## 2023-10-19 MED ORDER — VITAMIN D (ERGOCALCIFEROL) 1.25 MG (50000 UNIT) PO CAPS: 50000.0000 [IU] | ORAL_CAPSULE | ORAL | 1 refills | Status: AC

## 2023-10-25 ENCOUNTER — Telehealth: Payer: Self-pay | Admitting: Family Medicine

## 2023-10-25 NOTE — Telephone Encounter (Signed)
 Copied from CRM [id number]. Topic: Referral - Status >> Oct 25, 2023  5:02 PM Gennette ORN wrote: Reason for CRM: Patient is stating the referral she was given is full until October

## 2023-10-26 NOTE — Telephone Encounter (Signed)
 Error CRM created, need further information.

## 2023-11-08 IMAGING — CR DG ANKLE COMPLETE 3+V*R*
3 series · 3 of 3 positions shown · non-contrast
Comparison: None Available.

CLINICAL DATA: Pain

EXAM:
RIGHT ANKLE - COMPLETE 3+ VIEW

[ankle ap]
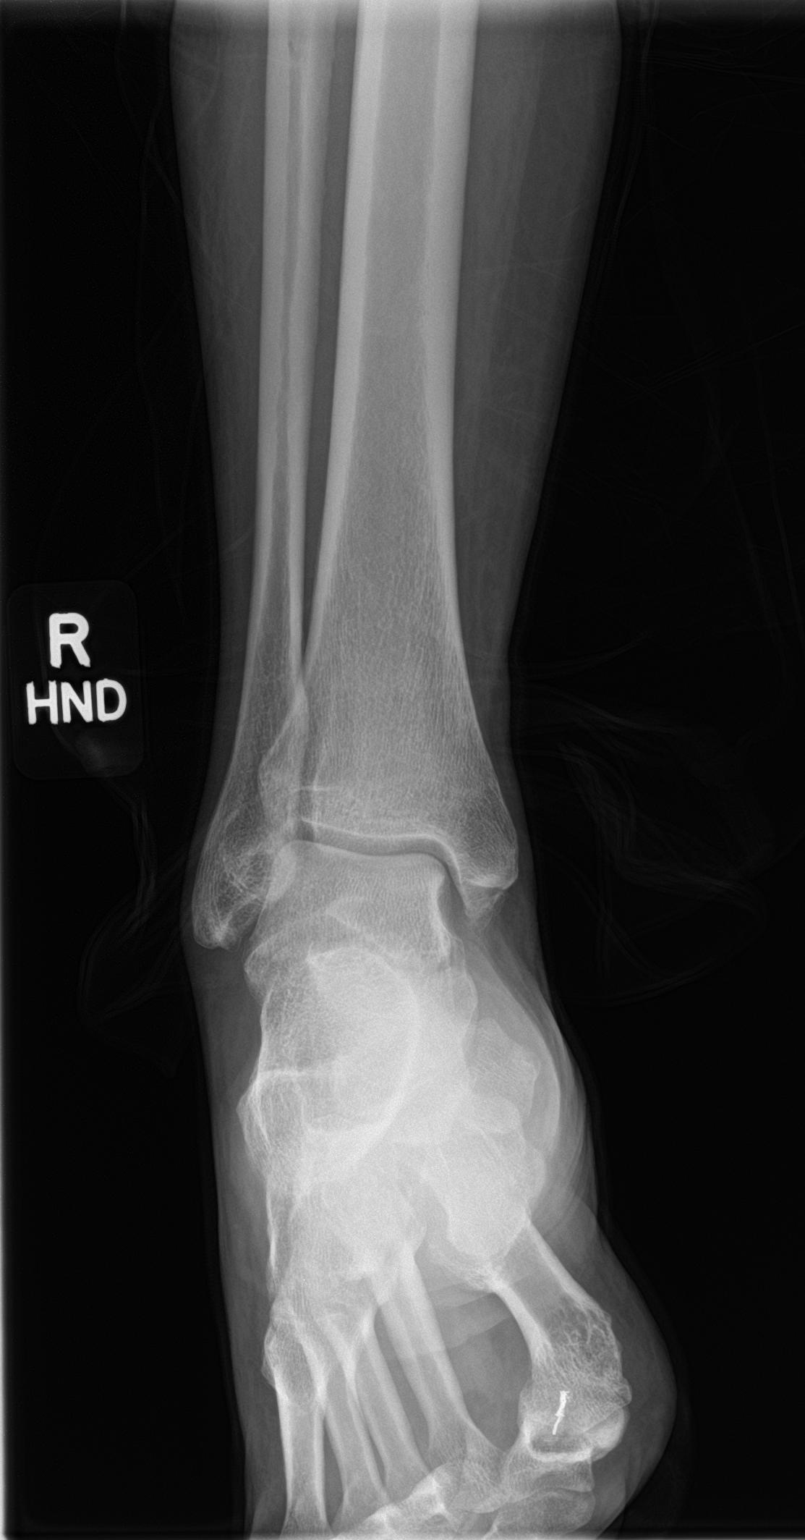

[ankle obl]
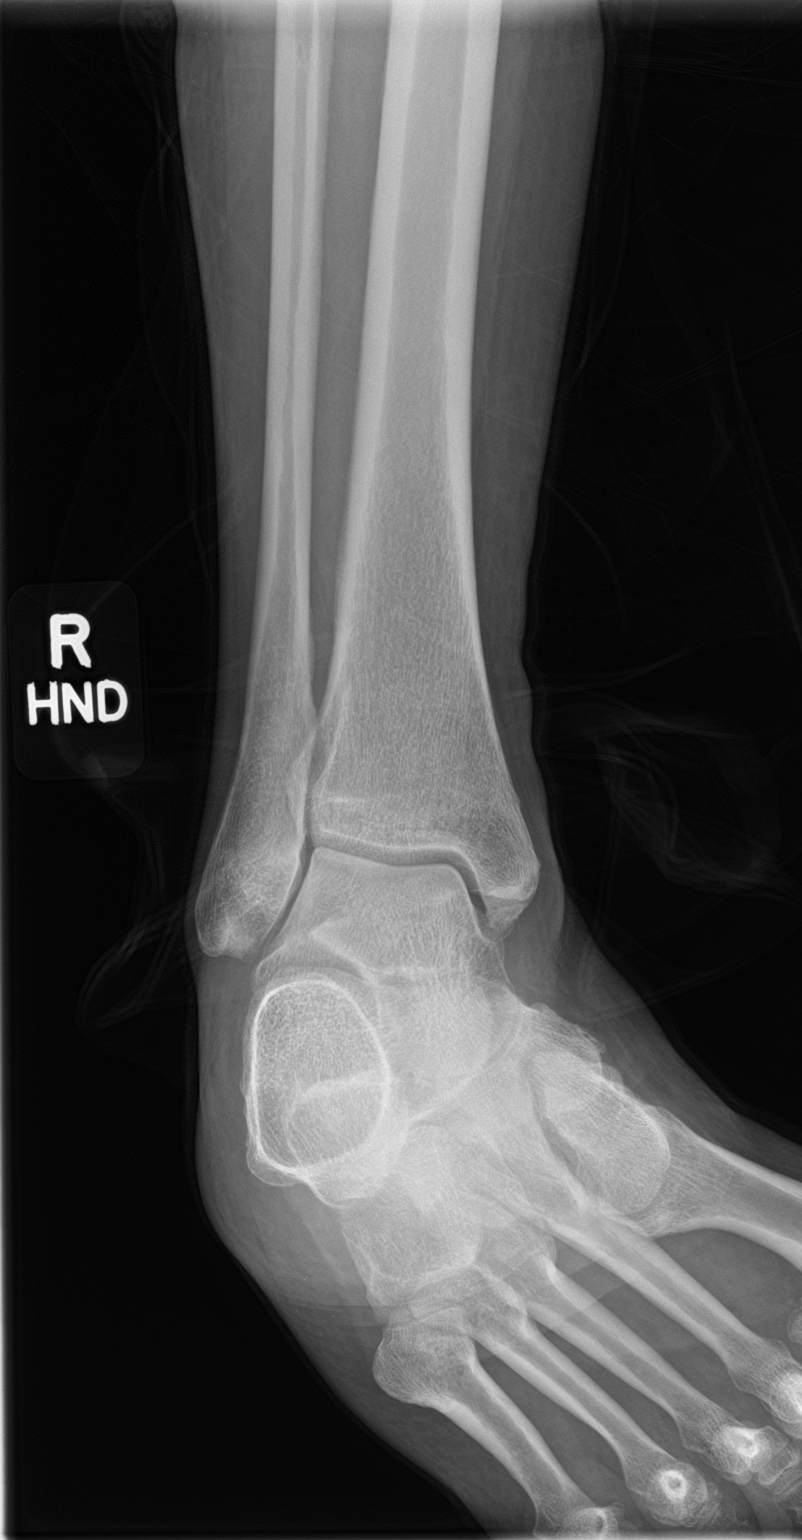

[ankle lat]
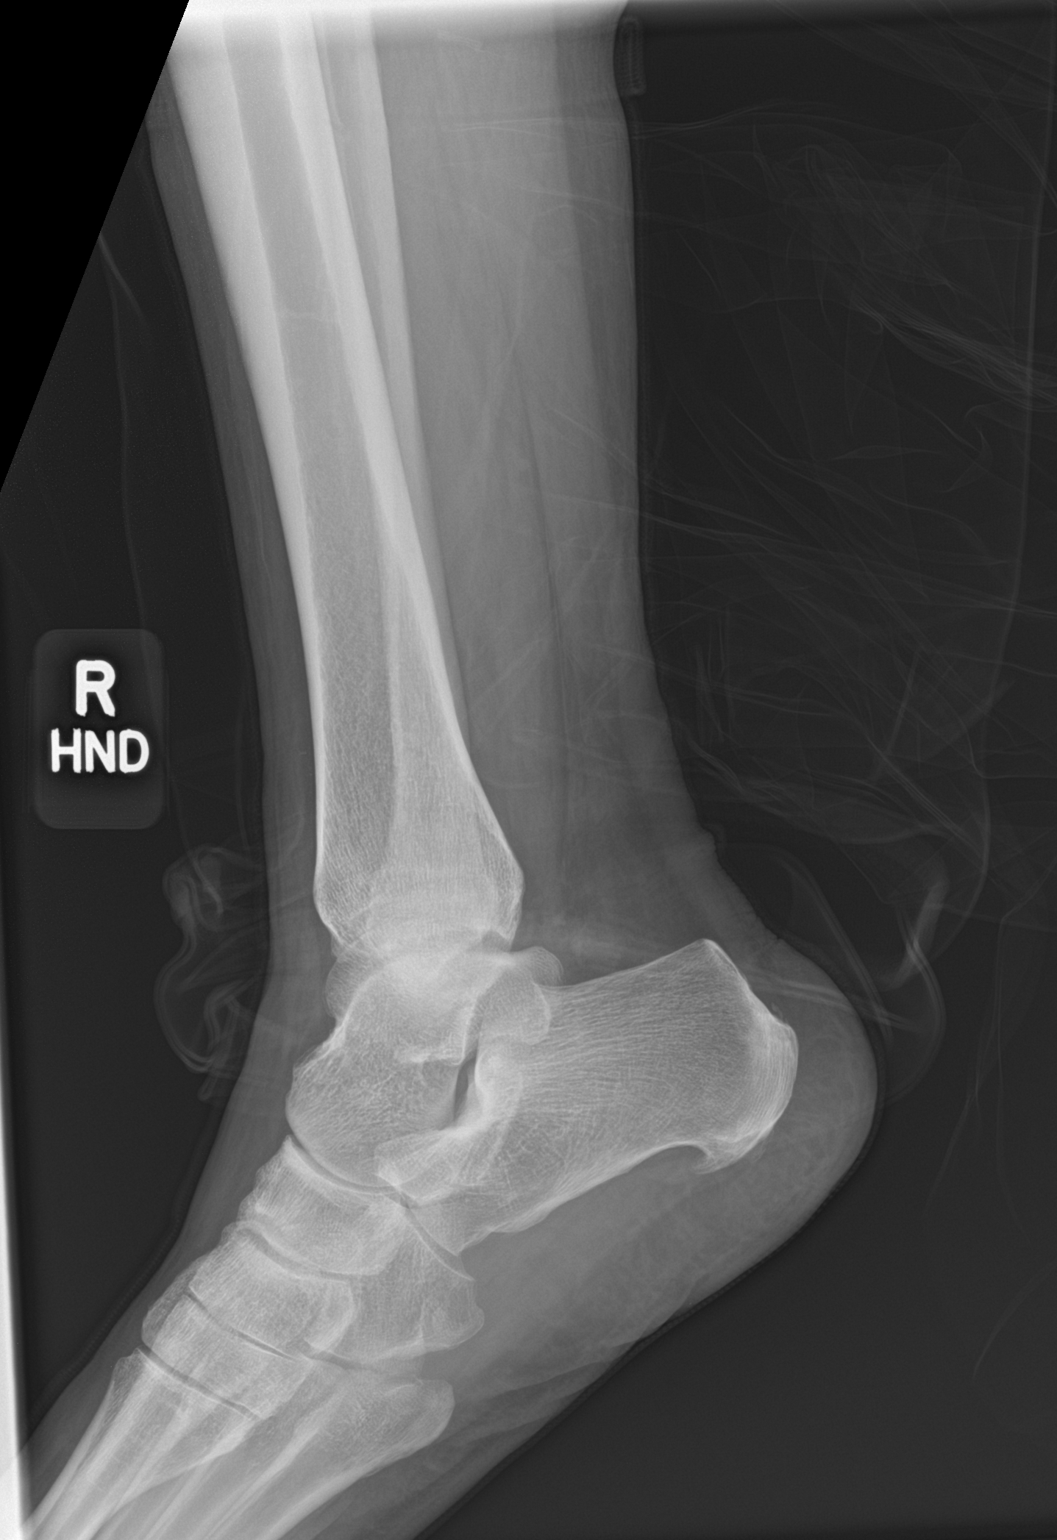

[3 of 3 positions shown; findings below may reference images not displayed]

FINDINGS: No recent fracture or dislocation is seen. Small plantar spur is
seen in calcaneus. Minimal bony spurs are noted in the intertarsal
joints seen in the lateral view.
IMPRESSION: No acute findings are seen in the right ankle. Plantar spur is seen
in calcaneus.

## 2023-12-29 ENCOUNTER — Other Ambulatory Visit: Payer: Self-pay | Admitting: Family Medicine

## 2023-12-29 DIAGNOSIS — I10 Essential (primary) hypertension: Secondary | ICD-10-CM

## 2024-01-16 ENCOUNTER — Ambulatory Visit

## 2024-01-16 VITALS — BP 118/64 | HR 55 | Ht 67.0 in | Wt 220.0 lb

## 2024-01-16 DIAGNOSIS — I2510 Atherosclerotic heart disease of native coronary artery without angina pectoris: Secondary | ICD-10-CM | POA: Diagnosis not present

## 2024-01-16 DIAGNOSIS — R001 Bradycardia, unspecified: Secondary | ICD-10-CM

## 2024-01-16 DIAGNOSIS — I10 Essential (primary) hypertension: Secondary | ICD-10-CM

## 2024-01-16 DIAGNOSIS — R42 Dizziness and giddiness: Secondary | ICD-10-CM

## 2024-01-16 NOTE — Progress Notes (Signed)
 Cardiology Office Note Date:  01/16/2024  ID:  Haizley, Cannella 13-Sep-1965, MRN [id number] PCP:  Antonio Meth, Jamee SAUNDERS, DO  Cardiologist:   Joelle VEAR Ren Donley, MD  Chief Complaint  Patient presents with   Bradycardia    History of Present Illness: Tamara Morton is a 58 y.o. female who presents for bradycardia.  She has a history of high blood pressure since her 20s. She had tried multiple medications in the past and the only combination that seems to work was diltiazem  and spironolactone .  She reports that a few weeks ago, she started to have some presyncope episodes with walking.  She had 4 episodes but denies any loss of consciousness.  She went to her PCP that was concerned about diltiazem  causing her symptoms.  She lowered the dose from 300 to 240 and her symptoms resolved.  She reports that she is only coming in because her heart rate dropped to the 40s at night.  She has not had any symptoms since her diltiazem  dose was dropped.  She denies orthopnea, PND, or lower extremity edema.    ROS: Please see the history of present illness. All other systems are reviewed and negative.   Past Medical History:  Diagnosis Date   Chicken pox    Family history of breast cancer    Family history of colon cancer    Family history of ovarian cancer    Family history of prostate cancer    Family history of stomach cancer    History of stomach ulcers    Hypertension     Past Surgical History:  Procedure Laterality Date   ABDOMINAL HYSTERECTOMY  2009   uterine fibroids  TAH BSO   FOOT SURGERY Right About 6-7 yrs ago    Current Outpatient Medications  Medication Sig Dispense Refill   diltiazem  (DILACOR XR ) 240 MG 24 hr capsule Take 1 capsule (240 mg total) by mouth daily. 90 capsule 1   spironolactone  (ALDACTONE ) 25 MG tablet Take 1 tablet (25 mg total) by mouth daily. 90 tablet 1   Vitamin D , Ergocalciferol , (DRISDOL ) 1.25 MG (50000 UNIT) CAPS capsule Take 1 capsule (50,000  Units total) by mouth every 7 (seven) days. 12 capsule 1   No current facility-administered medications for this visit.    Allergies:   Patient has no known allergies.   Social History:  Noncontributory  Family History:  Aunt w/ MI in her late 79s.  PHYSICAL EXAM: VS:  BP 118/64 (BP Location: Right Arm, Patient Position: Sitting, Cuff Size: Normal)   Pulse (!) 55   Ht 5' 7 (1.702 m)   Wt 220 lb (99.8 kg)   SpO2 94%   BMI 34.46 kg/m  , BMI Body mass index is 34.46 kg/m. GEN: Well nourished, well developed, in no acute distress HEENT: normal Neck: no JVD, carotid bruits, or masses Cardiac: Bradycardic but regular rhythm; no murmurs, rubs, or gallops,no edema  Respiratory:  CTAB bilaterally, normal work of breathing GI: soft, nontender, nondistended, + BS Extremities: No LE edema Skin: warm and dry, no rash Neuro:  Strength and sensation are intact  EKG: Sinus bradycardia 54  Recent Labs: 10/10/2023: ALT 27; BUN 13; Creatinine, Ser 1.17; Hemoglobin 13.7; Platelets 247.0; Potassium 3.6; Sodium 138; TSH 2.34      Component Value Date/Time   CHOL 151 10/10/2023 0924   TRIG 71.0 10/10/2023 0924   HDL 50.30 10/10/2023 0924   CHOLHDL 3 10/10/2023 0924   VLDL 14.2 10/10/2023  0924   LDLCALC 87 10/10/2023 0924   LDLCALC 76 05/01/2021 1454     Wt Readings from Last 5 Encounters:  01/16/24 220 lb (99.8 kg)  10/17/23 216 lb 3.2 oz (98.1 kg)  10/10/23 217 lb 3.2 oz (98.5 kg)  10/15/22 213 lb 9.6 oz (96.9 kg)  10/12/21 221 lb 9.6 oz (100.5 kg)     BP Readings from Last 5 Encounters:  01/16/24 118/64  10/17/23 104/78  10/10/23 130/72  07/03/23 113/74  10/15/22 110/80    Studies: Reviewed  ASSESSMENT AND PLAN: Tamara Morton is a 58 y.o. female who presents for bradycardia. - Presenting with bradycardia in the setting of being on diltiazem  for blood pressure.  She will she has not had any symptoms since her dose was decreased from 300 to 240.  Follows heart rate  dropping to the 40s at night.  I suspect this is vagal mediated.  I am not concerned about any sinus node dysfunction or heart block at this time.  We will get a 72-hour Zio patch just to make sure. - She has had hypertension since her 16s.  Will send renin Aldo ratio to screen for hyper Aldo. - Given longstanding hypertension, I will also screen for CAD with a calcium score.    Signed, Joelle VEAR Ren Donley, MD  01/16/2024 10:18 AM    [facility]

## 2024-01-16 NOTE — Progress Notes (Unsigned)
 Enrolled for [employer] to mail a ZIO XT long term holter monitor to the patients address on file.

## 2024-01-16 NOTE — Patient Instructions (Signed)
 Medication Instructions:  NO CHANGES  Lab Work: CT CALCIUM SCORE AND ALDOSTERONE/RENIN-A  Testing/Procedures: NONE  Follow-Up: At [facility], you and your health needs are our priority.  As part of our continuing mission to provide you with exceptional heart care, our providers are all part of one team.  This team includes your primary Cardiologist (physician) and Advanced Practice Providers or APPs (Physician Assistants and Nurse Practitioners) who all work together to provide you with the care you need, when you need it.  Your next appointment:   AS NEEDED  Provider:   Joelle VEAR Ren Donley, MD

## 2024-01-18 ENCOUNTER — Ambulatory Visit (HOSPITAL_COMMUNITY)
Admission: RE | Admit: 2024-01-18 | Discharge: 2024-01-18 | Disposition: A | Discharge status: Home / Self Care | Payer: Self-pay | Source: Ambulatory Visit | Attending: Cardiology | Admitting: Cardiology

## 2024-01-18 DIAGNOSIS — I2510 Atherosclerotic heart disease of native coronary artery without angina pectoris: Secondary | ICD-10-CM | POA: Insufficient documentation

## 2024-01-19 ENCOUNTER — Ambulatory Visit: Payer: Self-pay

## 2024-01-19 LAB — ALDOSTERONE + RENIN ACTIVITY W/ RATIO
Aldos/Renin Ratio: 19.4 (ref 0.0–30.0)
Aldosterone: 9.7 ng/dL (ref 0.0–30.0)
Renin Activity, Plasma: 0.501 ng/mL/h (ref 0.167–5.380)

## 2024-01-25 ENCOUNTER — Ambulatory Visit: Admitting: Student in an Organized Health Care Education/Training Program

## 2024-02-10 DIAGNOSIS — R001 Bradycardia, unspecified: Secondary | ICD-10-CM | POA: Diagnosis not present

## 2024-05-15 ENCOUNTER — Ambulatory Visit: Admitting: Radiology

## 2024-05-15 ENCOUNTER — Other Ambulatory Visit: Payer: Self-pay

## 2024-05-15 ENCOUNTER — Ambulatory Visit
Admission: EM | Admit: 2024-05-15 | Discharge: 2024-05-15 | Disposition: A | Discharge status: Home / Self Care | Source: Home / Self Care

## 2024-05-15 DIAGNOSIS — M545 Low back pain, unspecified: Secondary | ICD-10-CM

## 2024-05-15 DIAGNOSIS — W009XXA Unspecified fall due to ice and snow, initial encounter: Secondary | ICD-10-CM

## 2024-05-15 MED ORDER — PREDNISONE 20 MG PO TABS: ORAL_TABLET | ORAL | 0 refills | Status: AC
